# Patient Record
Sex: Male | Born: 1960 | Race: White | Hispanic: No | Marital: Married | Smoking: Never smoker
Health system: Southern US, Community
[De-identification: ages and names within clinical notes are randomized; demographics above are authoritative.]

## PROBLEM LIST (undated history)

## (undated) DIAGNOSIS — Z87442 Personal history of urinary calculi: Secondary | ICD-10-CM

## (undated) DIAGNOSIS — I10 Essential (primary) hypertension: Secondary | ICD-10-CM

## (undated) DIAGNOSIS — Z8489 Family history of other specified conditions: Secondary | ICD-10-CM

---

## 2021-09-21 ENCOUNTER — Other Ambulatory Visit: Payer: Self-pay | Admitting: Family Medicine

## 2021-09-21 DIAGNOSIS — M5416 Radiculopathy, lumbar region: Secondary | ICD-10-CM

## 2021-10-16 ENCOUNTER — Other Ambulatory Visit: Payer: Self-pay

## 2021-10-22 ENCOUNTER — Ambulatory Visit
Admission: RE | Admit: 2021-10-22 | Discharge: 2021-10-22 | Disposition: A | Discharge status: Home / Self Care | Payer: Self-pay | Source: Ambulatory Visit | Attending: Family Medicine | Admitting: Family Medicine

## 2021-10-22 ENCOUNTER — Other Ambulatory Visit: Payer: Self-pay

## 2021-10-22 DIAGNOSIS — M5416 Radiculopathy, lumbar region: Secondary | ICD-10-CM

## 2022-03-18 ENCOUNTER — Emergency Department: Payer: BC Managed Care – PPO

## 2022-03-18 ENCOUNTER — Ambulatory Visit
Admission: RE | Admit: 2022-03-18 | Discharge: 2022-03-18 | Disposition: A | Discharge status: Home / Self Care | Payer: BC Managed Care – PPO | Source: Ambulatory Visit

## 2022-03-18 ENCOUNTER — Other Ambulatory Visit: Payer: Self-pay

## 2022-03-18 ENCOUNTER — Emergency Department
Admission: EM | Admit: 2022-03-18 | Discharge: 2022-03-18 | Disposition: A | Discharge status: Home / Self Care | Payer: BC Managed Care – PPO | Attending: Emergency Medicine | Admitting: Emergency Medicine

## 2022-03-18 ENCOUNTER — Ambulatory Visit (INDEPENDENT_AMBULATORY_CARE_PROVIDER_SITE_OTHER): Payer: BC Managed Care – PPO

## 2022-03-18 ENCOUNTER — Encounter: Payer: Self-pay | Admitting: Emergency Medicine

## 2022-03-18 VITALS — BP 143/80 | HR 88 | Temp 98.8°F | Resp 14 | Ht 72.0 in | Wt 240.0 lb

## 2022-03-18 DIAGNOSIS — M795 Residual foreign body in soft tissue: Secondary | ICD-10-CM

## 2022-03-18 DIAGNOSIS — S60453A Superficial foreign body of left middle finger, initial encounter: Secondary | ICD-10-CM | POA: Insufficient documentation

## 2022-03-18 DIAGNOSIS — W458XXA Other foreign body or object entering through skin, initial encounter: Secondary | ICD-10-CM | POA: Insufficient documentation

## 2022-03-18 DIAGNOSIS — S6992XA Unspecified injury of left wrist, hand and finger(s), initial encounter: Secondary | ICD-10-CM

## 2022-03-18 DIAGNOSIS — S60459A Superficial foreign body of unspecified finger, initial encounter: Secondary | ICD-10-CM | POA: Diagnosis not present

## 2022-03-18 HISTORY — DX: Essential (primary) hypertension: I10

## 2022-03-18 MED ORDER — CEPHALEXIN 500 MG PO CAPS: 500.0000 mg | ORAL_CAPSULE | Freq: Three times a day (TID) | ORAL | 0 refills | Status: DC

## 2022-03-18 MED ORDER — BUPIVACAINE HCL (PF) 0.5 % IJ SOLN
20.0000 mL | Freq: Once | INTRAMUSCULAR | Status: AC
  Administered 2022-03-18: 10 mL
  Filled 2022-03-18: qty 20

## 2022-03-18 MED ORDER — CEPHALEXIN 500 MG PO CAPS
500.0000 mg | ORAL_CAPSULE | Freq: Once | ORAL | Status: AC
  Administered 2022-03-18: 500 mg via ORAL
  Filled 2022-03-18: qty 1

## 2022-03-18 NOTE — ED Triage Notes (Signed)
Patient c/o foreign body in LFT middle finger x 3-4 weeks.   Patient endorses  "steell belt on a tire stabbed my finger".   Patient endorse attempted to remove  a steel pin in finger".   Patient endorses increased pain and a blister that has formed.   Patient received a tetanus last month per patient statement.

## 2022-03-18 NOTE — ED Provider Triage Note (Signed)
Emergency Medicine Provider Triage Evaluation Note  NASH BOLLS , a 61 y.o. male  was evaluated in triage.  Pt complains of 3rd finger laceration. Happened at work with a steel piece of equipment that punctured his finger 6 months ago. Over the past 3 days he noticed white pus and a blister.  Wrapped it up, but continued to have pain so he stuck in a needle in it and dug around to find a splinter and went to urgent care who sent him here. The provider cut open his finger but couldn't get the foreign body so she told him to go home and soak his finger in epsom salts. He reports he was frustrated with that so he came to the ER for removal.  Tdap up to date.  XRs from today in computer  Review of Systems  Positive: L 3rd finger wrapped up Negative: Paresthesias, fever  Physical Exam  There were no vitals taken for this visit. Gen:   Awake, no distress   Resp:  Normal effort  MSK:   Moves extremities without difficulty Other:  L 3rd finger wrapped up  Medical Decision Making  Medically screening exam initiated at 4:05 PM.  Appropriate orders placed.  GERHART RUGGIERI was informed that the remainder of the evaluation will be completed by another provider, this initial triage assessment does not replace that evaluation, and the importance of remaining in the ED until their evaluation is complete.    Jackelyn Hoehn, PA-C 03/18/22 1612

## 2022-03-18 NOTE — Discharge Instructions (Signed)
Follow up with [facility], ask for hand specialist

## 2022-03-18 NOTE — ED Triage Notes (Signed)
Pt via POV from home. Pt had a small piece of metal into his L middle finger approx 6 weeks ago. Pt was seen at UC to get the it removed. States that he tried to get it out himself and made the pain worse a couple weeks ago. Pt was sent from the UC to here. Pt had it drained today and patient states the numbing medication is wearing off. Pt is A&OX4 and NAD>

## 2022-03-18 NOTE — Discharge Instructions (Signed)
We were unable to remove the metalic sliver in your finger with a small inision. I would recommend soaking your finger in very warm epsom salt water at the strength indicated on the box several times daily until it is removed. If it still does not come out, you may need a deeper surgical procedure with a hand specialist to remove it. Please apply topical antibiotic ointment to the affected area several times daily. Keep it covered during the day and open at night.

## 2022-03-18 NOTE — ED Provider Notes (Signed)
[facility] Provider Note    Event Date/Time   First MD Initiated Contact with Patient 03/18/22 Douglas Gibson     (approximate)   History   Laceration   HPI  Douglas Gibson is a 61 y.o. male with history of foreign body in his left middle finger for 6 weeks presents emergency department stating he would like to have foreign body removed.  Patient went to urgent care and they did try to remove it by doing x-rays and an incision.  They were unable to remove the small sliver of metal.  Patient's Tdap was updated.      Physical Exam   Triage Vital Signs: ED Triage Vitals  Enc Vitals Group     BP 03/18/22 1610 (!) 155/98     Pulse Rate 03/18/22 1610 79     Resp 03/18/22 1610 16     Temp 03/18/22 1610 98.6 F (37 C)     Temp Source 03/18/22 1610 Oral     SpO2 03/18/22 1610 98 %     Weight 03/18/22 1613 240 lb (108.9 kg)     Height 03/18/22 1613 6' (1.829 m)     Head Circumference --      Peak Flow --      Pain Score 03/18/22 1613 0     Pain Loc --      Pain Edu? --      Excl. in GC? --     Most recent vital signs: Vitals:   03/18/22 1610  BP: (!) 155/98  Pulse: 79  Resp: 16  Temp: 98.6 F (37 C)  SpO2: 98%     General: Awake, no distress.   CV:  Good peripheral perfusion. regular rate and  rhythm Resp:  Normal effort.  Abd:  No distention.   Other:  No obvious foreign body noted to the naked eye   ED Results / Procedures / Treatments   Labs (all labs ordered are listed, but only abnormal results are displayed) Labs Reviewed - No data to display   EKG     RADIOLOGY X-ray of the left middle finger reviewed from urgent care Repeat x-ray left middle finger    PROCEDURES:   .Nerve Block  Date/Time: 03/18/2022 9:20 PM  Performed by: Faythe Ghee, PA-C Authorized by: Faythe Ghee, PA-C   Consent:    Consent obtained:  Verbal   Consent given by:  Patient   Risks, benefits, and alternatives were discussed: yes     Risks  discussed:  Allergic reaction, infection, nerve damage, unsuccessful block, pain, intravenous injection and bleeding   Alternatives discussed:  No treatment Universal protocol:    Procedure explained and questions answered to patient or proxy's satisfaction: yes     Patient identity confirmed:  Verbally with patient Indications:    Indications:  Pain relief and procedural anesthesia Pre-procedure details:    Skin preparation:  Alcohol Skin anesthesia:    Skin anesthesia method:  None Procedure details:    Block needle gauge:  22 G   Anesthetic injected:  Bupivacaine 0.5% w/o epi   Injection procedure:  Anatomic landmarks identified, incremental injection, negative aspiration for blood, anatomic landmarks palpated and introduced needle Post-procedure details:    Outcome:  Anesthesia achieved   Procedure completion:  Tolerated    MEDICATIONS ORDERED IN ED: Medications  bupivacaine(PF) (MARCAINE) 0.5 % injection 20 mL (10 mLs Infiltration Given by Other 03/18/22 2000)  cephALEXin (KEFLEX) capsule 500 mg (500 mg Oral Given  03/18/22 2121)     IMPRESSION / MDM / ASSESSMENT AND PLAN / ED COURSE  I reviewed the triage vital signs and the nursing notes.                              Differential diagnosis includes, but is not limited to, foreign body, abrasion, infection  Patient's presentation is most consistent with acute complicated illness / injury requiring diagnostic workup.   We did try to remove the foreign body.  Patient was given a digital block.  We were unsuccessful and removing the foreign body.  We tried ultrasound guidance along with probing and were unable to feel the metal piece.  Due to the amount of probing we decided to place patient on antibiotic to prevent infection.  Feel that he should follow-up with hand specialist to have the area removed.  Return emergency department if worsening.  He is discharged stable condition   FINAL CLINICAL IMPRESSION(S) / ED DIAGNOSES    Final diagnoses:  Foreign body (FB) in soft tissue     Rx / DC Orders   ED Discharge Orders          Ordered    cephALEXin (KEFLEX) 500 MG capsule  3 times daily        03/18/22 2116             Note:  This document was prepared using Dragon voice recognition software and may include unintentional dictation errors.    Faythe Ghee, PA-C 03/18/22 2122    Sharyn Creamer, MD 03/19/22 Windy Fast

## 2022-03-21 NOTE — ED Provider Notes (Signed)
Renaldo Fiddler    CSN: [id number] Arrival date & time: 03/18/22  1231      History   Chief Complaint Chief Complaint  Patient presents with   Foreign Body    HPI Douglas Gibson is a 61 y.o. male.   Pleasant 61yo male presents today requesting removal of a FB in his L middle finger. 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 Believes roughly 6 weeks ago when he was strapping down a tire, the metal in it stabbed him in the finger. He states he updated his Tdap within one week of the injury. His pain from the puncture wound resolved shortly thereafter, but recently started bothering him again and he feels like there is a bump on the lateral aspect of his finger where the metal is trying to extrude. Pt denies any erythema or drainage from the site. Minimal swelling. FROM of digit and hand, normal sensation. Would like to have FB removed.    Foreign Body   Past Medical History:  Diagnosis Date   Hypertension     There are no problems to display for this patient.   History reviewed. No pertinent surgical history.     Home Medications    Prior to Admission medications   Medication Sig Start Date End Date Taking? Authorizing Provider  hydrochlorothiazide (HYDRODIURIL) 12.5 MG tablet Take 12.5 mg by mouth daily. 02/22/22  Yes [provider]  losartan (COZAAR) 100 MG tablet Take 100 mg by mouth daily. 02/22/22  Yes [provider]  meloxicam (MOBIC) 15 MG tablet Take 15 mg by mouth daily. 03/17/22  Yes [provider]  cephALEXin (KEFLEX) 500 MG capsule Take 1 capsule (500 mg total) by mouth 3 (three) times daily. 03/18/22   Sherrie Mustache Roselyn Bering, PA-C    Family History History reviewed. No pertinent family history.  Social History Social History   Tobacco Use   Smoking status: Never   Smokeless tobacco: Never  Substance Use Topics   Alcohol use: Not Currently   Drug use: Never     Allergies   Patient has no known  allergies.   Review of Systems Review of Systems  Musculoskeletal:  Positive for myalgias (L middle finger - distal phalanx).  All other systems reviewed and are negative.    Physical Exam Triage Vital Signs ED Triage Vitals  Enc Vitals Group     BP 03/18/22 1303 (!) 143/80     Pulse Rate 03/18/22 1303 88     Resp 03/18/22 1303 14     Temp 03/18/22 1303 98.8 F (37.1 C)     Temp Source 03/18/22 1303 Oral     SpO2 03/18/22 1303 96 %     Weight 03/18/22 1309 240 lb (108.9 kg)     Height 03/18/22 1309 6' (1.829 m)     Head Circumference --      Peak Flow --      Pain Score 03/18/22 1306 8     Pain Loc --      Pain Edu? --      Excl. in GC? --    No data found.  Updated Vital Signs BP (!) 143/80 (BP Location: Left Arm)   Pulse 88   Temp 98.8 F (37.1 C) (Oral)   Resp 14   Ht 6' (1.829 m)   Wt 240 lb (108.9 kg)   SpO2 96%   BMI 32.55 kg/m   Visual Acuity Right Eye Distance:   Left Eye  Distance:   Bilateral Distance:    Right Eye Near:   Left Eye Near:    Bilateral Near:     Physical Exam Vitals and nursing note reviewed.  Constitutional:      General: He is not in acute distress.    Appearance: Normal appearance. He is not ill-appearing, toxic-appearing or diaphoretic.  HENT:     Head: Normocephalic.  Cardiovascular:     Rate and Rhythm: Normal rate.  Pulmonary:     Effort: Pulmonary effort is normal. No respiratory distress.  Musculoskeletal:        General: Tenderness (to lateral/ radial aspect of distal phalanx L middle finger) present. No swelling. Normal range of motion.  Skin:    General: Skin is warm and dry.     Capillary Refill: Capillary refill takes less than 2 seconds.     Coloration: Skin is not jaundiced.     Findings: No bruising, erythema or rash.  Neurological:     General: No focal deficit present.     Mental Status: He is alert and oriented to person, place, and time.     Sensory: Sensory deficit present.     Motor: No weakness.      Gait: Gait normal.  Psychiatric:        Mood and Affect: Mood normal.        Behavior: Behavior normal.      UC Treatments / Results  Labs (all labs ordered are listed, but only abnormal results are displayed) Labs Reviewed - No data to display  EKG   Radiology LEFT MIDDLE FINGER 2+V   COMPARISON:  None Available.   FINDINGS: Linear 5 mm metallic foreign body within the soft tissues of the distal phalanx. Assuming the patient's ring is on his ring finger, this is located in the radial soft tissues of the distal phalanx. There is associated soft tissue swelling. No evidence of acute fracture, dislocation or bone destruction.   IMPRESSION: Metallic foreign body within the distal soft tissues of the long finger. No osseous abnormality.     Electronically Signed   By: Carey Bullocks M.D.   On: 03/18/2022 13:41  Procedures Foreign Body Removal  Date/Time: 03/18/2022 1:55 PM  Performed by: Maretta Bees, PA Authorized by: Maretta Bees, PA   Consent:    Consent obtained:  Verbal   Consent given by:  Patient   Risks, benefits, and alternatives were discussed: yes     Risks discussed:  Bleeding, incomplete removal, infection, nerve damage, pain, poor cosmetic result and worsening of condition   Alternatives discussed:  No treatment, alternative treatment and referral Universal protocol:    Procedure explained and questions answered to patient or proxy's satisfaction: yes     Imaging studies available: yes     Site/side marked: yes     Patient identity confirmed:  Verbally with patient Location:    Location:  Finger   Finger location:  L middle finger   Depth:  Subcutaneous   Tendon involvement:  None Pre-procedure details:    Imaging:  X-ray   Neurovascular status: intact   Anesthesia:    Anesthesia method:  Local infiltration   Local anesthetic:  Lidocaine 1% w/o epi Procedure type:    Procedure complexity:  Simple Procedure details:    Incision  length:  1mm   Dissection of underlying tissues: no     Bloodless field: no     Removal mechanism:  Hemostat, magnetic extraction and forceps   Guidance:  serial x-rays     Guidance comment:  Additional xray obtained with forceps in place to ensure proper location   Intact foreign body removal: no (UNABLE TO SUCCESSFULLY REMOVE FB)   Post-procedure details:    Neurovascular status: intact     Confirmation:  Residual foreign bodies remain   Skin closure:  None   Dressing:  Antibiotic ointment and non-adherent dressing   Procedure completion:  Procedure terminated electively by provider  (including critical care time)  Medications Ordered in UC Medications - No data to display  Initial Impression / Assessment and Plan / UC Course  I have reviewed the triage vital signs and the nursing notes.  Pertinent labs & imaging results that were available during my care of the patient were reviewed by me and considered in my medical decision making (see chart for details).     Metalic FB in L middle finger - confirmed by xray. Discussed with pt options included observation and allowing self-extrusion, FB removal in office, and referral to hand specialist. Pt opted to attempt FB removal. Ultimately the procedure was terminated after one hour of unsuccessful attempt. Additional xray was taken to confirm location of FB with forceps in place, confirming appropriate incision location. Discussed with pt that there may be scar tissue forming around the FB given the timeframe it has been in his finger. Recommend warm epsom salt soaks to see if this acts as a drawing sav, and follow up with hand specialist should it remain. Head to ER if any immediate complications.   Final Clinical Impressions(s) / UC Diagnoses   Final diagnoses:  Metal foreign body in finger     Discharge Instructions      We were unable to remove the metalic sliver in your finger with a small inision. I would recommend soaking  your finger in very warm epsom salt water at the strength indicated on the box several times daily until it is removed. If it still does not come out, you may need a deeper surgical procedure with a hand specialist to remove it. Please apply topical antibiotic ointment to the affected area several times daily. Keep it covered during the day and open at night.    ED Prescriptions   None    PDMP not reviewed this encounter.   Maretta Bees, [place] 03/21/22 2338

## 2022-04-10 NOTE — Progress Notes (Unsigned)
Referring Physician:  Merri Ray, MD [address] [place],  [place] [address]  Primary Physician:  Mick Sell, MD  History of Present Illness: 04/11/2022 Mr. Douglas Gibson is here today with a chief complaint of bilateral low back pain that radiates into the bilateral buttocks. Also complains of numbness that will get the entire right leg and intermittent numbness and tingling in the feet.   Been having worsening issues over the past 15 years.  His back and leg discomfort is now impacting his day-to-day life.  By the end of the day, he has significant numbness and discomfort in his legs.  He cannot stand in 1 position for as long as he would like due to discomfort.  Standing, walking, bending, and lifting make his pain worse.  It can be as bad as 8 out of 10.  He gets sharp discomfort into his buttocks and stiff and aching discomfort elsewhere.  Meloxicam has helped.  He has tried physical therapy and injections without improvement.  Bowel/Bladder Dysfunction: none  Conservative measures:  Physical therapy:  has participated in at [facility] including regular antiinflammatories: meloxicam Injections: has had epidural steroid injections 02/07/2022: MBB to the bilateral L4-5 and L5-S1 facet joints (8/10 to no relief) 12/13/2021: Bilateral S1 transforaminal ESI (questionable relief for 1 week)  Past Surgery: denies  Douglas Gibson has no symptoms of cervical myelopathy.  The symptoms are causing a significant impact on the patient's life.   Review of Systems:  A 10 point review of systems is negative, except for the pertinent positives and negatives detailed in the HPI.  Past Medical History: Past Medical History:  Diagnosis Date   Hypertension     Past Surgical History: No past surgical history on file.  Allergies: Allergies as of 04/11/2022   (No Known Allergies)    Medications: Current Meds  Medication Sig    hydrochlorothiazide (HYDRODIURIL) 12.5 MG tablet Take 12.5 mg by mouth daily.   losartan (COZAAR) 100 MG tablet Take 100 mg by mouth daily.   meloxicam (MOBIC) 15 MG tablet Take 15 mg by mouth daily.    Social History: Social History   Tobacco Use   Smoking status: Never   Smokeless tobacco: Never  Substance Use Topics   Alcohol use: Not Currently   Drug use: Never    Family Medical History: No family history on file.  Physical Examination: Vitals:   04/11/22 0826  BP: (!) 148/86    General: Patient is well developed, well nourished, calm, collected, and in no apparent distress. Attention to examination is appropriate.  Neck:   Supple.  Full range of motion.  Respiratory: Patient is breathing without any difficulty.   NEUROLOGICAL:     Awake, alert, oriented to person, place, and time.  Speech is clear and fluent. Fund of knowledge is appropriate.   Cranial Nerves: Pupils equal round and reactive to light.  Facial tone is symmetric.  Facial sensation is symmetric. Shoulder shrug is symmetric. Tongue protrusion is midline.  There is no pronator drift.  ROM of spine: full.    Strength: Side Biceps Triceps Deltoid Interossei Grip Wrist Ext. Wrist Flex.  R 5 5 5 5 5 5 5   L 5 5 5 5 5 5 5    Side Iliopsoas Quads Hamstring PF DF EHL  R 5 5 5 5 5 5   L 5 5 5 5 5 5    Reflexes are 1+ and symmetric at the biceps, triceps, brachioradialis, patella  and achilles.   Hoffman's is absent.  Clonus is not present.  Toes are down-going.  Bilateral upper and lower extremity sensation is intact to light touch.    No evidence of dysmetria noted.  Gait is normal.    Medical Decision Making  Imaging: MRI L spine 10/22/2021 L3-4: Mild bilateral foraminal stenosis due to disc bulge and facet arthropathy.   L4-5: Prominent central narrowing of the thecal sac with moderate bilateral foraminal stenosis and moderate bilateral subarticular lateral recess stenosis due to disc uncovering,  disc bulge, and degenerative facet arthropathy. Left foraminal annular tear noted. AP diameter of the thecal sac is 0.6 cm^2 and the cross-sectional area of the thecal sac is 0.3 cm^2   L5-S1: Mild bilateral foraminal stenosis and borderline right subarticular lateral recess stenosis due to disc bulge, intervertebral spurring, and facet arthropathy.   IMPRESSION: 1. Lumbar spondylosis and degenerative disc disease, causing prominent impingement at L4-5 and mild impingement at L2-3, L3-4, and L5-S1, as detailed above. Anterolisthesis at L4-5 is also contributory to the impingement at that level. 2. Degenerative facet edema bilaterally at L4-5 and to a lesser extent at L3-4.     Electronically Signed   By: Gaylyn Rong M.D.   On: 10/23/2021 13:34   Flexion-extension x-rays of the lumbar spine on April 11, 2022 show anterolisthesis at L4-5.  This is 5 and half millimeters on extension increasing to 11.3 mm on flexion.  I have personally reviewed the images and agree with the above interpretation.  Assessment and Plan: Douglas Gibson is a pleasant 61 y.o. 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  He has severe stenosis at L4-5 due to this.  He has 5 mm of change in his anterolisthesis between flexion and extension.  This suggests instability contributing to his current symptoms.  He has tried and failed conservative management including physical therapy and injections.  At this point, no further conservative management is indicated.  Given the anterolisthesis and facet arthrosis which results in abnormality in the L4-5 facet joints bilaterally and dislocation of the inferior articulating process with relation to the superior articulating process, surgical fusion will be necessary to reestablish a normal spinal canal diameter due to implied and inherent instability.  I recommend we proceed with L4-5 lateral lumbar interbody fusion with percutaneous fixation.   Would like to get flexion-extension x-rays today to determine whether lateral interbody fusion versus transforaminal interbody fusion is the most appropriate option.  With an anterolisthesis, I normally favor a lateral lumbar interbody fusion for anterior column reconstruction followed by percutaneous fixation.   I spent a total of 30 minutes in face-to-face and non-face-to-face activities related to this patient's care today.  Thank you for involving me in the care of this patient.      Jaevian Shean K. Myer Haff MD, [facility]

## 2022-04-11 ENCOUNTER — Ambulatory Visit
Admission: RE | Admit: 2022-04-11 | Discharge: 2022-04-11 | Disposition: A | Discharge status: Home / Self Care | Payer: BC Managed Care – PPO | Attending: Neurosurgery | Admitting: Neurosurgery

## 2022-04-11 ENCOUNTER — Ambulatory Visit: Payer: BC Managed Care – PPO | Admitting: Neurosurgery

## 2022-04-11 ENCOUNTER — Encounter: Payer: Self-pay | Admitting: Neurosurgery

## 2022-04-11 ENCOUNTER — Ambulatory Visit
Admission: RE | Admit: 2022-04-11 | Discharge: 2022-04-11 | Disposition: A | Discharge status: Home / Self Care | Payer: BC Managed Care – PPO | Source: Ambulatory Visit | Attending: Neurosurgery | Admitting: Neurosurgery

## 2022-04-11 VITALS — BP 148/86 | Ht 72.0 in | Wt 240.0 lb

## 2022-04-11 DIAGNOSIS — M4316 Spondylolisthesis, lumbar region: Secondary | ICD-10-CM

## 2022-04-11 DIAGNOSIS — G8929 Other chronic pain: Secondary | ICD-10-CM | POA: Insufficient documentation

## 2022-04-11 DIAGNOSIS — M5441 Lumbago with sciatica, right side: Secondary | ICD-10-CM | POA: Insufficient documentation

## 2022-04-11 DIAGNOSIS — M5442 Lumbago with sciatica, left side: Secondary | ICD-10-CM

## 2022-04-14 ENCOUNTER — Other Ambulatory Visit: Payer: Self-pay

## 2022-04-14 DIAGNOSIS — Z01818 Encounter for other preprocedural examination: Secondary | ICD-10-CM

## 2022-04-18 ENCOUNTER — Telehealth: Payer: Self-pay

## 2022-04-18 NOTE — Telephone Encounter (Signed)
-----   Message from Rockey Situ sent at 04/18/2022  8:08 AM EDT ----- Regarding: r/s sx Contact: [phone] I called patient to give him his pre-op appt and he said that he needs to reschedule his surgery date.

## 2022-04-18 NOTE — Telephone Encounter (Signed)
I spoke with Douglas Gibson. We have rescheduled his surgery to 05/22/22.

## 2022-04-26 ENCOUNTER — Telehealth: Payer: Self-pay

## 2022-04-26 MED ORDER — MELOXICAM 15 MG PO TABS: 15.0000 mg | ORAL_TABLET | Freq: Every day | ORAL | 0 refills | Status: DC

## 2022-04-26 NOTE — Telephone Encounter (Signed)
-----   Message from Rockey Situ sent at 04/26/2022  8:16 AM EDT ----- Regarding: URGENT request Contact: [phone]  L4-5 XLIF/PSF on 05/22/22 [facility] [employer] Meloxicam 15mg  1 aday. Can the office prescribe this to get him through to surgery? Patient has contacted for the past 4 days with out a response. Patient is leaving town to his son's wedding by plane today at 4pm..

## 2022-04-26 NOTE — Telephone Encounter (Signed)
I sent in a refill for him to take up until surgery.   Patty, please remind him that he cannot take this for 3 months from the date of his surgery.

## 2022-04-26 NOTE — Telephone Encounter (Signed)
Patient notified and he aware that this medication can not be taken for 3 months after surgery.

## 2022-04-28 ENCOUNTER — Other Ambulatory Visit: Payer: BC Managed Care – PPO

## 2022-05-10 ENCOUNTER — Encounter
Admission: RE | Admit: 2022-05-10 | Discharge: 2022-05-10 | Disposition: A | Discharge status: Home / Self Care | Payer: BC Managed Care – PPO | Source: Ambulatory Visit | Attending: Neurosurgery | Admitting: Neurosurgery

## 2022-05-10 VITALS — BP 142/87 | HR 84 | Temp 98.7°F | Resp 16 | Ht 72.0 in | Wt 243.9 lb

## 2022-05-10 DIAGNOSIS — I10 Essential (primary) hypertension: Secondary | ICD-10-CM | POA: Diagnosis not present

## 2022-05-10 DIAGNOSIS — Z01812 Encounter for preprocedural laboratory examination: Secondary | ICD-10-CM

## 2022-05-10 DIAGNOSIS — Z01818 Encounter for other preprocedural examination: Secondary | ICD-10-CM | POA: Insufficient documentation

## 2022-05-10 HISTORY — DX: Personal history of urinary calculi: Z87.442

## 2022-05-10 HISTORY — DX: Family history of other specified conditions: Z84.89

## 2022-05-10 LAB — URINALYSIS, ROUTINE W REFLEX MICROSCOPIC
Bilirubin Urine: NEGATIVE
Glucose, UA: NEGATIVE mg/dL
Hgb urine dipstick: NEGATIVE
Ketones, ur: NEGATIVE mg/dL
Leukocytes,Ua: NEGATIVE
Nitrite: NEGATIVE
Protein, ur: NEGATIVE mg/dL
Specific Gravity, Urine: 1.017 (ref 1.005–1.030)
pH: 5 (ref 5.0–8.0)

## 2022-05-10 LAB — BASIC METABOLIC PANEL
Anion gap: 6 (ref 5–15)
BUN: 20 mg/dL (ref 6–20)
CO2: 29 mmol/L (ref 22–32)
Calcium: 9.5 mg/dL (ref 8.9–10.3)
Chloride: 105 mmol/L (ref 98–111)
Creatinine, Ser: 0.94 mg/dL (ref 0.61–1.24)
GFR, Estimated: >60 mL/min (ref >60)
Glucose, Bld: 153 mg/dL — ABNORMAL HIGH (ref 70–99)
Potassium: 3.7 mmol/L (ref 3.5–5.1)
Sodium: 140 mmol/L (ref 135–145)

## 2022-05-10 LAB — CBC
HCT: 40.9 % (ref 39.0–52.0)
Hemoglobin: 14.1 g/dL (ref 13.0–17.0)
MCH: 32.9 pg (ref 26.0–34.0)
MCHC: 34.5 g/dL (ref 30.0–36.0)
MCV: 95.6 fL (ref 80.0–100.0)
Platelets: 194 10*3/uL (ref 150–400)
RBC: 4.28 MIL/uL (ref 4.22–5.81)
RDW: 12.8 % (ref 11.5–15.5)
WBC: 8.2 10*3/uL (ref 4.0–10.5)
nRBC: 0 % (ref 0.0–0.2)

## 2022-05-10 LAB — SURGICAL PCR SCREEN
MRSA, PCR: NEGATIVE
Staphylococcus aureus: NEGATIVE

## 2022-05-10 LAB — TYPE AND SCREEN
ABO/RH(D): O POS
Antibody Screen: NEGATIVE

## 2022-05-10 NOTE — Patient Instructions (Signed)
Your procedure is scheduled on: Monday May 22, 2022. Report to [facility] inside [facility] 2nd floor, stop by registration desk first before getting on elevator.  To find out your arrival time please call [phone] between 1PM - 3PM on Friday May 19, 2022.  Remember: Instructions that are not followed completely may result in serious medical risk,  up to and including death, or upon the discretion of your surgeon and anesthesiologist your  surgery may need to be rescheduled.     _X__ 1. Do not eat food after midnight the night before your procedure.                 No chewing gum or hard candies. You may drink clear liquids up to 2 hours                 before you are scheduled to arrive for your surgery- DO not drink clear                 liquids within 2 hours of the start of your surgery.                 Clear Liquids include:  water, apple juice without pulp, clear Gatorade, G2 or                  Gatorade Zero (avoid Red/Purple/Blue), Black Coffee or Tea (Do not add                 anything to coffee or tea).  __X__2.  On the morning of surgery brush your teeth with toothpaste and water, you                may rinse your mouth with mouthwash if you wish.  Do not swallow any toothpaste or mouthwash.     _X__ 3.  No Alcohol for 24 hours before or after surgery.   _X__ 4.  Do Not Smoke or use e-cigarettes For 24 Hours Prior to Your Surgery.                 Do not use any chewable tobacco products for at least 6 hours prior to                 Surgery.  _X__  5.  Do not use any recreational drugs (marijuana, cocaine, heroin, ecstasy, MDMA or other)                For at least one week prior to your surgery.  Combination of these drugs with anesthesia                May have life threatening results.  ____  6.  Bring all medications with you on the day of surgery if instructed.   __X__  7.  Notify your doctor if there is any change in your medical  condition      (cold, fever, infections).     Do not wear jewelry, make-up, hairpins, clips or nail polish. Do not wear lotions, powders, or perfumes. You may wear deodorant. Do not shave 48 hours prior to surgery. Men may shave face and neck. Do not bring valuables to the hospital.    [facility] is not responsible for any belongings or valuables.  Contacts, dentures or bridgework may not be worn into surgery. Leave your suitcase in the car. After surgery it may be brought to your room. For patients admitted to the hospital, discharge time  is determined by your treatment team.   Patients discharged the day of surgery will not be allowed to drive home.   Make arrangements for someone to be with you for the first 24 hours of your Same Day Discharge.   __X__ Take these medicines the morning of surgery with A SIP OF WATER:    1. None   2.   3.   4.  5.  6.  ____ Fleet Enema (as directed)   __X__ Use CHG Soap (or wipes) as directed  ____ Use Benzoyl Peroxide Gel as instructed  ____ Use inhalers on the day of surgery  ____ Stop metformin 2 days prior to surgery    ____ Take 1/2 of usual insulin dose the night before surgery. No insulin the morning          of surgery.   ____ Call your PCP, cardiologist, or Pulmonologist if taking Coumadin/Plavix/aspirin and ask when to stop before your surgery.   __X__ One Week prior to surgery- Stop Anti-inflammatories such as Ibuprofen, Aleve, Advil, Motrin, meloxicam (MOBIC), diclofenac, etodolac, ketorolac, Toradol, Daypro, piroxicam, Goody's or BC powders. OK TO USE TYLENOL IF NEEDED   __X__ Do not start any vitamins and or supplements until after surgery.    ____ Bring C-Pap to the hospital.    If you have any questions regarding your pre-procedure instructions,  Please call Pre-admit Testing at [phone]

## 2022-05-17 ENCOUNTER — Telehealth: Payer: Self-pay

## 2022-05-17 NOTE — Telephone Encounter (Signed)
-----   Message from Peggyann Shoals sent at 05/17/2022  1:04 PM EDT ----- Regarding: drainage sx on 10/9 Contact: [phone]  L4-5 XLIF/PSF on 05/22/22 For a week he has had runny nose and his voice is raspy. No fever, he feels fine, negative Covid. He has not taken anything OTC for these symptoms. Preop told him to check with the surgeon.

## 2022-05-17 NOTE — Telephone Encounter (Signed)
He agreed with Dr.Yarbrough's response.

## 2022-05-22 ENCOUNTER — Other Ambulatory Visit: Payer: Self-pay

## 2022-05-22 ENCOUNTER — Inpatient Hospital Stay: Payer: BC Managed Care – PPO

## 2022-05-22 ENCOUNTER — Encounter: Payer: Self-pay | Admitting: Neurosurgery

## 2022-05-22 ENCOUNTER — Inpatient Hospital Stay
Admission: RE | Admit: 2022-05-22 | Discharge: 2022-05-23 | DRG: 455 | Disposition: A | Discharge status: Home / Self Care | Payer: BC Managed Care – PPO | Attending: Neurosurgery | Admitting: Neurosurgery

## 2022-05-22 ENCOUNTER — Encounter
Admission: RE | Disposition: A | Discharge status: Home / Self Care | Payer: Self-pay | Source: Home / Self Care | Attending: Neurosurgery

## 2022-05-22 ENCOUNTER — Inpatient Hospital Stay: Payer: BC Managed Care – PPO | Admitting: Anesthesiology

## 2022-05-22 ENCOUNTER — Inpatient Hospital Stay: Payer: BC Managed Care – PPO | Admitting: Urgent Care

## 2022-05-22 DIAGNOSIS — M5442 Lumbago with sciatica, left side: Secondary | ICD-10-CM | POA: Diagnosis present

## 2022-05-22 DIAGNOSIS — M4316 Spondylolisthesis, lumbar region: Secondary | ICD-10-CM | POA: Diagnosis present

## 2022-05-22 DIAGNOSIS — M5441 Lumbago with sciatica, right side: Secondary | ICD-10-CM | POA: Diagnosis present

## 2022-05-22 DIAGNOSIS — G8929 Other chronic pain: Secondary | ICD-10-CM | POA: Diagnosis present

## 2022-05-22 DIAGNOSIS — I10 Essential (primary) hypertension: Secondary | ICD-10-CM | POA: Diagnosis present

## 2022-05-22 DIAGNOSIS — Z01818 Encounter for other preprocedural examination: Secondary | ICD-10-CM

## 2022-05-22 HISTORY — PX: APPLICATION OF INTRAOPERATIVE CT SCAN: SHX6668

## 2022-05-22 HISTORY — PX: ANTERIOR LATERAL LUMBAR FUSION WITH PERCUTANEOUS SCREW 1 LEVEL: SHX5553

## 2022-05-22 LAB — ABO/RH: ABO/RH(D): O POS

## 2022-05-22 SURGERY — ANTERIOR LATERAL LUMBAR FUSION WITH PERCUTANEOUS SCREW 1 LEVEL
Anesthesia: General

## 2022-05-22 MED ORDER — CELECOXIB 200 MG PO CAPS
200.0000 mg | ORAL_CAPSULE | Freq: Two times a day (BID) | ORAL | Status: DC
  Administered 2022-05-22 – 2022-05-23 (×2): 200 mg via ORAL
  Filled 2022-05-22 (×3): qty 1

## 2022-05-22 MED ORDER — DEXAMETHASONE SODIUM PHOSPHATE 10 MG/ML IJ SOLN
INTRAMUSCULAR | Status: DC | PRN
  Administered 2022-05-22: 8 mg via INTRAVENOUS

## 2022-05-22 MED ORDER — REMIFENTANIL HCL 1 MG IV SOLR
INTRAVENOUS | Status: DC | PRN
  Administered 2022-05-22: .2 ug/kg/min via INTRAVENOUS

## 2022-05-22 MED ORDER — ROCURONIUM BROMIDE 100 MG/10ML IV SOLN
INTRAVENOUS | Status: DC | PRN
  Administered 2022-05-22: 30 mg via INTRAVENOUS

## 2022-05-22 MED ORDER — PHENYLEPHRINE HCL-NACL 20-0.9 MG/250ML-% IV SOLN
INTRAVENOUS | Status: AC
  Filled 2022-05-22: qty 250

## 2022-05-22 MED ORDER — BUPIVACAINE LIPOSOME 1.3 % IJ SUSP
INTRAMUSCULAR | Status: AC
  Filled 2022-05-22: qty 20

## 2022-05-22 MED ORDER — MORPHINE SULFATE (PF) 2 MG/ML IV SOLN
INTRAVENOUS | Status: AC
  Administered 2022-05-22: 2 mg via INTRAVENOUS
  Filled 2022-05-22: qty 1

## 2022-05-22 MED ORDER — LIDOCAINE HCL (CARDIAC) PF 100 MG/5ML IV SOSY
PREFILLED_SYRINGE | INTRAVENOUS | Status: DC | PRN
  Administered 2022-05-22: 100 mg via INTRAVENOUS

## 2022-05-22 MED ORDER — MIDAZOLAM HCL 2 MG/2ML IJ SOLN
INTRAMUSCULAR | Status: DC | PRN
  Administered 2022-05-22: 2 mg via INTRAVENOUS

## 2022-05-22 MED ORDER — VANCOMYCIN HCL IN DEXTROSE 1-5 GM/200ML-% IV SOLN
INTRAVENOUS | Status: AC
  Administered 2022-05-22: 1000 mg via INTRAVENOUS
  Filled 2022-05-22: qty 200

## 2022-05-22 MED ORDER — ONDANSETRON HCL 4 MG/2ML IJ SOLN: 4.0000 mg | Freq: Once | INTRAMUSCULAR | Status: DC | PRN

## 2022-05-22 MED ORDER — MENTHOL 3 MG MT LOZG: 1.0000 | LOZENGE | OROMUCOSAL | Status: DC | PRN

## 2022-05-22 MED ORDER — METHOCARBAMOL 500 MG PO TABS
500.0000 mg | ORAL_TABLET | Freq: Four times a day (QID) | ORAL | Status: DC | PRN
  Administered 2022-05-22: 500 mg via ORAL
  Filled 2022-05-22: qty 1

## 2022-05-22 MED ORDER — CEFAZOLIN SODIUM-DEXTROSE 2-4 GM/100ML-% IV SOLN
2.0000 g | Freq: Once | INTRAVENOUS | Status: AC
  Administered 2022-05-22: 2 g via INTRAVENOUS

## 2022-05-22 MED ORDER — FENTANYL CITRATE (PF) 250 MCG/5ML IJ SOLN
INTRAMUSCULAR | Status: AC
  Filled 2022-05-22: qty 5

## 2022-05-22 MED ORDER — SODIUM CHLORIDE 0.9 % IV SOLN: INTRAVENOUS | Status: DC

## 2022-05-22 MED ORDER — ENOXAPARIN SODIUM 40 MG/0.4ML IJ SOSY
40.0000 mg | PREFILLED_SYRINGE | INTRAMUSCULAR | Status: DC
  Administered 2022-05-23: 40 mg via SUBCUTANEOUS
  Filled 2022-05-22: qty 0.4

## 2022-05-22 MED ORDER — LACTATED RINGERS IV SOLN: INTRAVENOUS | Status: DC

## 2022-05-22 MED ORDER — SODIUM CHLORIDE 0.9% FLUSH
3.0000 mL | Freq: Two times a day (BID) | INTRAVENOUS | Status: DC
  Administered 2022-05-22 – 2022-05-23 (×3): 3 mL via INTRAVENOUS

## 2022-05-22 MED ORDER — ACETAMINOPHEN 10 MG/ML IV SOLN: 1000.0000 mg | Freq: Once | INTRAVENOUS | Status: DC | PRN

## 2022-05-22 MED ORDER — MORPHINE SULFATE (PF) 2 MG/ML IV SOLN
2.0000 mg | INTRAVENOUS | Status: DC | PRN
  Administered 2022-05-22: 2 mg via INTRAVENOUS
  Filled 2022-05-22: qty 1

## 2022-05-22 MED ORDER — PROPOFOL 1000 MG/100ML IV EMUL
INTRAVENOUS | Status: AC
  Filled 2022-05-22: qty 100

## 2022-05-22 MED ORDER — ACETAMINOPHEN 325 MG PO TABS: 650.0000 mg | ORAL_TABLET | ORAL | Status: DC | PRN

## 2022-05-22 MED ORDER — SURGIPHOR WOUND IRRIGATION SYSTEM - OPTIME
TOPICAL | Status: DC | PRN
  Administered 2022-05-22: 450 mL via TOPICAL

## 2022-05-22 MED ORDER — CHLORHEXIDINE GLUCONATE 0.12 % MT SOLN
OROMUCOSAL | Status: AC
  Filled 2022-05-22: qty 15

## 2022-05-22 MED ORDER — OXYCODONE HCL 5 MG PO TABS
5.0000 mg | ORAL_TABLET | Freq: Once | ORAL | Status: AC | PRN
  Administered 2022-05-22: 5 mg via ORAL

## 2022-05-22 MED ORDER — SUCCINYLCHOLINE CHLORIDE 200 MG/10ML IV SOSY
PREFILLED_SYRINGE | INTRAVENOUS | Status: DC | PRN
  Administered 2022-05-22: 100 mg via INTRAVENOUS

## 2022-05-22 MED ORDER — FAMOTIDINE 20 MG PO TABS: 20.0000 mg | ORAL_TABLET | Freq: Once | ORAL | Status: DC

## 2022-05-22 MED ORDER — ACETAMINOPHEN 10 MG/ML IV SOLN
INTRAVENOUS | Status: DC | PRN
  Administered 2022-05-22: 1000 mg via INTRAVENOUS

## 2022-05-22 MED ORDER — SODIUM CHLORIDE 0.9% FLUSH: 3.0000 mL | INTRAVENOUS | Status: DC | PRN

## 2022-05-22 MED ORDER — ONDANSETRON HCL 4 MG PO TABS: 4.0000 mg | ORAL_TABLET | Freq: Four times a day (QID) | ORAL | Status: DC | PRN

## 2022-05-22 MED ORDER — PHENOL 1.4 % MT LIQD: 1.0000 | OROMUCOSAL | Status: DC | PRN

## 2022-05-22 MED ORDER — HYDROCHLOROTHIAZIDE 12.5 MG PO TABS
12.5000 mg | ORAL_TABLET | Freq: Every day | ORAL | Status: DC
  Administered 2022-05-22 – 2022-05-23 (×2): 12.5 mg via ORAL
  Filled 2022-05-22 (×2): qty 1

## 2022-05-22 MED ORDER — REMIFENTANIL HCL 1 MG IV SOLR
INTRAVENOUS | Status: AC
  Filled 2022-05-22: qty 1000

## 2022-05-22 MED ORDER — BUPIVACAINE-EPINEPHRINE 0.5% -1:200000 IJ SOLN
INTRAMUSCULAR | Status: DC | PRN
  Administered 2022-05-22: 2 mL
  Administered 2022-05-22: 10 mL

## 2022-05-22 MED ORDER — SODIUM CHLORIDE (PF) 0.9 % IJ SOLN
INTRAMUSCULAR | Status: DC | PRN
  Administered 2022-05-22: 60 mL via INTRAMUSCULAR

## 2022-05-22 MED ORDER — LACTATED RINGERS IV SOLN: INTRAVENOUS | Status: DC | PRN

## 2022-05-22 MED ORDER — OXYCODONE HCL 5 MG/5ML PO SOLN: 5.0000 mg | Freq: Once | ORAL | Status: AC | PRN

## 2022-05-22 MED ORDER — GLYCOPYRROLATE 0.2 MG/ML IJ SOLN
INTRAMUSCULAR | Status: DC | PRN
  Administered 2022-05-22: .2 mg via INTRAVENOUS

## 2022-05-22 MED ORDER — ACETAMINOPHEN 650 MG RE SUPP: 650.0000 mg | RECTAL | Status: DC | PRN

## 2022-05-22 MED ORDER — SURGIFLO WITH THROMBIN (HEMOSTATIC MATRIX KIT) OPTIME
TOPICAL | Status: DC | PRN
  Administered 2022-05-22: 1 via TOPICAL

## 2022-05-22 MED ORDER — ACETAMINOPHEN 10 MG/ML IV SOLN
INTRAVENOUS | Status: AC
  Filled 2022-05-22: qty 100

## 2022-05-22 MED ORDER — CEFAZOLIN SODIUM-DEXTROSE 2-4 GM/100ML-% IV SOLN
INTRAVENOUS | Status: AC
  Filled 2022-05-22: qty 100

## 2022-05-22 MED ORDER — ONDANSETRON HCL 4 MG/2ML IJ SOLN: 4.0000 mg | Freq: Four times a day (QID) | INTRAMUSCULAR | Status: DC | PRN

## 2022-05-22 MED ORDER — FENTANYL CITRATE (PF) 100 MCG/2ML IJ SOLN
25.0000 ug | INTRAMUSCULAR | Status: DC | PRN
  Administered 2022-05-22 (×3): 50 ug via INTRAVENOUS

## 2022-05-22 MED ORDER — OXYCODONE HCL 5 MG PO TABS
10.0000 mg | ORAL_TABLET | ORAL | Status: DC | PRN
  Administered 2022-05-22 – 2022-05-23 (×2): 10 mg via ORAL
  Filled 2022-05-22 (×2): qty 2

## 2022-05-22 MED ORDER — OXYCODONE HCL 5 MG PO TABS
ORAL_TABLET | ORAL | Status: AC
  Filled 2022-05-22: qty 1

## 2022-05-22 MED ORDER — OXYCODONE HCL 5 MG PO TABS: 5.0000 mg | ORAL_TABLET | ORAL | Status: DC | PRN

## 2022-05-22 MED ORDER — SUGAMMADEX SODIUM 200 MG/2ML IV SOLN
INTRAVENOUS | Status: DC | PRN
  Administered 2022-05-22: 200 mg via INTRAVENOUS

## 2022-05-22 MED ORDER — KETOROLAC TROMETHAMINE 15 MG/ML IJ SOLN
INTRAMUSCULAR | Status: AC
  Filled 2022-05-22: qty 1

## 2022-05-22 MED ORDER — EPHEDRINE SULFATE (PRESSORS) 50 MG/ML IJ SOLN
INTRAMUSCULAR | Status: DC | PRN
  Administered 2022-05-22 (×2): 10 mg via INTRAVENOUS

## 2022-05-22 MED ORDER — CHLORHEXIDINE GLUCONATE 0.12 % MT SOLN: 15.0000 mL | Freq: Once | OROMUCOSAL | Status: AC

## 2022-05-22 MED ORDER — LOSARTAN POTASSIUM 50 MG PO TABS
100.0000 mg | ORAL_TABLET | Freq: Every day | ORAL | Status: DC
  Administered 2022-05-22 – 2022-05-23 (×2): 100 mg via ORAL
  Filled 2022-05-22 (×2): qty 2

## 2022-05-22 MED ORDER — PROPOFOL 10 MG/ML IV BOLUS
INTRAVENOUS | Status: DC | PRN
  Administered 2022-05-22: 200 mg via INTRAVENOUS
  Administered 2022-05-22: 30 ug/kg/min via INTRAVENOUS

## 2022-05-22 MED ORDER — ONDANSETRON HCL 4 MG/2ML IJ SOLN
INTRAMUSCULAR | Status: DC | PRN
  Administered 2022-05-22: 4 mg via INTRAVENOUS

## 2022-05-22 MED ORDER — MIDAZOLAM HCL 2 MG/2ML IJ SOLN
INTRAMUSCULAR | Status: AC
  Filled 2022-05-22: qty 2

## 2022-05-22 MED ORDER — METHOCARBAMOL 1000 MG/10ML IJ SOLN
500.0000 mg | Freq: Four times a day (QID) | INTRAVENOUS | Status: DC | PRN
  Administered 2022-05-22: 500 mg via INTRAVENOUS
  Filled 2022-05-22: qty 500

## 2022-05-22 MED ORDER — SENNA 8.6 MG PO TABS
1.0000 | ORAL_TABLET | Freq: Two times a day (BID) | ORAL | Status: DC
  Administered 2022-05-22 – 2022-05-23 (×3): 8.6 mg via ORAL
  Filled 2022-05-22 (×3): qty 1

## 2022-05-22 MED ORDER — ORAL CARE MOUTH RINSE
15.0000 mL | Freq: Once | OROMUCOSAL | Status: AC
  Administered 2022-05-22: 15 mL via OROMUCOSAL

## 2022-05-22 MED ORDER — PHENYLEPHRINE HCL-NACL 20-0.9 MG/250ML-% IV SOLN
INTRAVENOUS | Status: DC | PRN
  Administered 2022-05-22: 40 ug/min via INTRAVENOUS

## 2022-05-22 MED ORDER — KETOROLAC TROMETHAMINE 15 MG/ML IJ SOLN
15.0000 mg | Freq: Four times a day (QID) | INTRAMUSCULAR | Status: AC
  Administered 2022-05-22 – 2022-05-23 (×4): 15 mg via INTRAVENOUS
  Filled 2022-05-22 (×3): qty 1

## 2022-05-22 MED ORDER — FENTANYL CITRATE (PF) 100 MCG/2ML IJ SOLN
INTRAMUSCULAR | Status: AC
  Filled 2022-05-22: qty 2

## 2022-05-22 MED ORDER — SODIUM CHLORIDE FLUSH 0.9 % IV SOLN
INTRAVENOUS | Status: AC
  Filled 2022-05-22: qty 10

## 2022-05-22 MED ORDER — SODIUM CHLORIDE 0.9 % IV SOLN: 250.0000 mL | INTRAVENOUS | Status: DC

## 2022-05-22 MED ORDER — BUPIVACAINE HCL (PF) 0.5 % IJ SOLN
INTRAMUSCULAR | Status: AC
  Filled 2022-05-22: qty 30

## 2022-05-22 MED ORDER — BUPIVACAINE-EPINEPHRINE (PF) 0.5% -1:200000 IJ SOLN
INTRAMUSCULAR | Status: AC
  Filled 2022-05-22: qty 30

## 2022-05-22 MED ORDER — VANCOMYCIN HCL IN DEXTROSE 1-5 GM/200ML-% IV SOLN: 1000.0000 mg | Freq: Once | INTRAVENOUS | Status: AC

## 2022-05-22 MED ORDER — PHENYLEPHRINE HCL (PRESSORS) 10 MG/ML IV SOLN
INTRAVENOUS | Status: DC | PRN
  Administered 2022-05-22: 160 ug via INTRAVENOUS

## 2022-05-22 MED ORDER — FENTANYL CITRATE (PF) 100 MCG/2ML IJ SOLN
INTRAMUSCULAR | Status: DC | PRN
  Administered 2022-05-22: 75 ug via INTRAVENOUS
  Administered 2022-05-22: 100 ug via INTRAVENOUS

## 2022-05-22 MED ORDER — SODIUM CHLORIDE FLUSH 0.9 % IV SOLN
INTRAVENOUS | Status: AC
  Filled 2022-05-22: qty 20

## 2022-05-22 SURGICAL SUPPLY — 86 items
ALLOGRAFT BONESTRP KORE 2.5X10 (Bone Implant) IMPLANT
BASIN KIT SINGLE STR (MISCELLANEOUS) ×1 IMPLANT
BUR NEURO DRILL SOFT 3.0X3.8M (BURR) IMPLANT
CAP LOCKING SPINE (Cap) IMPLANT
CHLORAPREP W/TINT 26 (MISCELLANEOUS) ×4 IMPLANT
CORD BIP STRL DISP 12FT (MISCELLANEOUS) ×1 IMPLANT
CORD LIGHT LATERIAL X LIFT (MISCELLANEOUS) IMPLANT
COUNTER NEEDLE 20/40 LG (NEEDLE) ×2 IMPLANT
COVER BACK TABLE REUSABLE LG (DRAPES) ×1 IMPLANT
CUP MEDICINE 2OZ PLAST GRAD ST (MISCELLANEOUS) ×1 IMPLANT
DERMABOND ADVANCED .7 DNX12 (GAUZE/BANDAGES/DRESSINGS) ×3 IMPLANT
DRAPE 3D C-ARM OEC (DRAPES) IMPLANT
DRAPE C ARM PK CFD 31 SPINE (DRAPES) ×1 IMPLANT
DRAPE C-ARMOR (DRAPES) IMPLANT
DRAPE INCISE IOBAN 66X45 STRL (DRAPES) ×1 IMPLANT
DRAPE LAPAROTOMY 100X77 ABD (DRAPES) ×2 IMPLANT
DRAPE MICROSCOPE SPINE 48X150 (DRAPES) IMPLANT
DRAPE SCAN PATIENT (DRAPES) ×1 IMPLANT
DRAPE SURG 17X11 SM STRL (DRAPES) ×2 IMPLANT
DRSG OPSITE POSTOP 4X6 (GAUZE/BANDAGES/DRESSINGS) IMPLANT
DRSG TEGADERM 2-3/8X2-3/4 SM (GAUZE/BANDAGES/DRESSINGS) IMPLANT
DRSG TEGADERM 4X4.75 (GAUZE/BANDAGES/DRESSINGS) IMPLANT
DRSG TEGADERM 6X8 (GAUZE/BANDAGES/DRESSINGS) IMPLANT
ELECT CAUTERY BLADE TIP 2.5 (TIP) ×1
ELECT EZSTD 165MM 6.5IN (MISCELLANEOUS) ×1
ELECT REM PT RETURN 9FT ADLT (ELECTROSURGICAL) ×2
ELECTRODE CAUTERY BLDE TIP 2.5 (TIP) ×1 IMPLANT
ELECTRODE EZSTD 165MM 6.5IN (MISCELLANEOUS) ×1 IMPLANT
ELECTRODE REM PT RTRN 9FT ADLT (ELECTROSURGICAL) ×2 IMPLANT
EX-PIN ORTHOLOCK NAV 4X150 (PIN) IMPLANT
FEE INTRAOP CADWELL SUPPLY NCS (MISCELLANEOUS) ×1 IMPLANT
FEE INTRAOP MONITOR IMPULS NCS (MISCELLANEOUS) ×1 IMPLANT
FORCEPS BPLR BAYO 10IN 1.0TIP (ORTHOPEDIC DISPOSABLE SUPPLIES) IMPLANT
GAUZE 4X4 16PLY ~~LOC~~+RFID DBL (SPONGE) ×1 IMPLANT
GLOVE BIOGEL PI IND STRL 6.5 (GLOVE) ×3 IMPLANT
GLOVE SURG SYN 6.5 ES PF (GLOVE) ×5 IMPLANT
GLOVE SURG SYN 6.5 PF PI (GLOVE) ×5 IMPLANT
GLOVE SURG SYN 8.5  E (GLOVE) ×6
GLOVE SURG SYN 8.5 E (GLOVE) ×6 IMPLANT
GLOVE SURG SYN 8.5 PF PI (GLOVE) ×6 IMPLANT
GOWN SRG LRG LVL 4 IMPRV REINF (GOWNS) ×2 IMPLANT
GOWN SRG XL LVL 3 NONREINFORCE (GOWNS) ×2 IMPLANT
GOWN STRL NON-REIN TWL XL LVL3 (GOWNS) ×2
GOWN STRL REIN LRG LVL4 (GOWNS) ×2
GRADUATE 1200CC STRL 31836 (MISCELLANEOUS) ×1 IMPLANT
HOLDER FOLEY CATH W/STRAP (MISCELLANEOUS) ×1 IMPLANT
INTRAOP CADWELL SUPPLY FEE NCS (MISCELLANEOUS) ×1
INTRAOP DISP SUPPLY FEE NCS (MISCELLANEOUS) ×1
INTRAOP MONITOR FEE IMPULS NCS (MISCELLANEOUS) ×1
INTRAOP MONITOR FEE IMPULSE (MISCELLANEOUS) ×1
K-WIRE 1.6 NITINOL SHARP TIP (WIRE) ×4
KIT DISP MARS 3V (KITS) IMPLANT
KIT PEDICLE ACCESS (KITS) IMPLANT
KIT SPINAL PRONEVIEW (KITS) ×1 IMPLANT
KIT TURNOVER KIT A (KITS) ×1 IMPLANT
KNIFE BAYONET SHORT DISCETOMY (MISCELLANEOUS) IMPLANT
KWIRE 1.6 NITINOL SHARP TIP (WIRE) IMPLANT
MANIFOLD NEPTUNE II (INSTRUMENTS) ×2 IMPLANT
MARKER SKIN DUAL TIP RULER LAB (MISCELLANEOUS) ×3 IMPLANT
MARKER SPHERE PSV REFLC 13MM (MARKER) ×7 IMPLANT
NDL SAFETY ECLIP 18X1.5 (MISCELLANEOUS) ×1 IMPLANT
PACK LAMINECTOMY NEURO (CUSTOM PROCEDURE TRAY) ×1 IMPLANT
PAD ARMBOARD 7.5X6 YLW CONV (MISCELLANEOUS) ×1 IMPLANT
ROD SPINE CURVE 5.5X55 (Rod) IMPLANT
SCREW CREO 6.5X45 FEN (Screw) IMPLANT
SCREW CREO MIS 30 TULIP (Screw) IMPLANT
SOLUTION IRRIG SURGIPHOR (IV SOLUTION) ×1 IMPLANT
SPACER HEDRON 10D 18X50X11 (Spacer) IMPLANT
SPONGE GAUZE 2X2 8PLY STRL LF (GAUZE/BANDAGES/DRESSINGS) IMPLANT
STAPLER SKIN PROX 35W (STAPLE) IMPLANT
SURGIFLO W/THROMBIN 8M KIT (HEMOSTASIS) ×1 IMPLANT
SUT DVC VLOC 3-0 CL 6 P-12 (SUTURE) ×1 IMPLANT
SUT ETHILON 3-0 FS-10 30 BLK (SUTURE)
SUT VIC AB 0 CT1 27 (SUTURE) ×1
SUT VIC AB 0 CT1 27XCR 8 STRN (SUTURE) ×1 IMPLANT
SUT VIC AB 2-0 CT1 18 (SUTURE) ×1 IMPLANT
SUTURE EHLN 3-0 FS-10 30 BLK (SUTURE) IMPLANT
SYR 30ML LL (SYRINGE) ×2 IMPLANT
TOWEL OR 17X26 4PK STRL BLUE (TOWEL DISPOSABLE) ×5 IMPLANT
TRAP FLUID SMOKE EVACUATOR (MISCELLANEOUS) ×1 IMPLANT
TRAY FOLEY MTR SLVR 16FR STAT (SET/KITS/TRAYS/PACK) IMPLANT
TROCAR INSERT W/PEDICLE NDL (TROCAR) IMPLANT
TROCAR INSERT W/PEDICLE NDLE (TROCAR)
TUBING CONNECTING 10 (TUBING) ×2 IMPLANT
WATER STERILE IRR 1000ML POUR (IV SOLUTION) ×2 IMPLANT
WATER STERILE IRR 500ML POUR (IV SOLUTION) IMPLANT

## 2022-05-22 NOTE — Anesthesia Preprocedure Evaluation (Addendum)
Anesthesia Evaluation  Patient identified by MRN, date of birth, ID band Patient awake    Reviewed: Allergy & Precautions, NPO status , Patient's Chart, lab work & pertinent test results  History of Anesthesia Complications Negative for: history of anesthetic complications  Airway Mallampati: II   Neck ROM: Full    Dental  (+) Chipped   Pulmonary neg pulmonary ROS,    Pulmonary exam normal breath sounds clear to auscultation       Cardiovascular hypertension, Normal cardiovascular exam Rhythm:Regular Rate:Normal  ECG 05/10/22: Normal sinus rhythm Rightward axis   Neuro/Psych Chronic pain    GI/Hepatic negative GI ROS,   Endo/Other  diabetes, Type 2Obesity   Renal/GU Renal disease (nephrolithiasis)     Musculoskeletal   Abdominal   Peds  Hematology negative hematology ROS (+)   Anesthesia Other Findings   Reproductive/Obstetrics                            Anesthesia Physical Anesthesia Plan  ASA: 2  Anesthesia Plan: General   Post-op Pain Management:    Induction: Intravenous  PONV Risk Score and Plan: 2 and Ondansetron, Dexamethasone and Treatment may vary due to age or medical condition  Airway Management Planned: Oral ETT  Additional Equipment:   Intra-op Plan:   Post-operative Plan: Extubation in OR  Informed Consent: I have reviewed the patients History and Physical, chart, labs and discussed the procedure including the risks, benefits and alternatives for the proposed anesthesia with the patient or authorized representative who has indicated his/her understanding and acceptance.     Dental advisory given  Plan Discussed with: CRNA  Anesthesia Plan Comments: (Patient consented for risks of anesthesia including but not limited to:  - adverse reactions to medications - damage to eyes, teeth, lips or other oral mucosa - nerve damage due to positioning  - sore throat  or hoarseness - damage to heart, brain, nerves, lungs, other parts of body or loss of life  Informed patient about role of CRNA in peri- and intra-operative care.  Patient voiced understanding.)        Anesthesia Quick Evaluation

## 2022-05-22 NOTE — Anesthesia Procedure Notes (Signed)
Procedure Name: Intubation Date/Time: 05/22/2022 7:49 AM  Performed by: Lowry Bowl, CRNAPre-anesthesia Checklist: Patient identified, Emergency Drugs available, Suction available and Patient being monitored Patient Re-evaluated:Patient Re-evaluated prior to induction Oxygen Delivery Method: Circle system utilized Preoxygenation: Pre-oxygenation with 100% oxygen Induction Type: IV induction Ventilation: Mask ventilation without difficulty Laryngoscope Size: McGraph and 4 Grade View: Grade I Tube type: Oral Tube size: 7.0 mm Number of attempts: 1 Airway Equipment and Method: Stylet and Video-laryngoscopy Placement Confirmation: ETT inserted through vocal cords under direct vision, positive ETCO2 and breath sounds checked- equal and bilateral Secured at: 22 cm Tube secured with: Tape Dental Injury: Teeth and Oropharynx as per pre-operative assessment

## 2022-05-22 NOTE — Progress Notes (Signed)
Called Dr. Izora Ribas patient voided 200 cc clear yellow urine, but bladder scan showed 675. Per Dr. Izora Ribas since patient has sensation to void, wait to see if patient can empty his bladder. Will continue to monitor

## 2022-05-22 NOTE — Op Note (Signed)
Indications: Mr. 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   Preoperative Note:   Risks of surgery discussed include: infection, bleeding, stroke, coma, death, paralysis, CSF leak, nerve/spinal cord injury, numbness, tingling, weakness, complex regional pain syndrome, recurrent stenosis and/or disc herniation, vascular injury, development of instability, neck/back pain, need for further surgery, persistent symptoms, development of deformity, and the risks of anesthesia. The patient understood these risks and agreed to proceed.  NAME OF ANTERIOR PROCEDURE:               1. Anterior lumbar interbody fusion via a right lateral retroperitoneal approach at L4/5 2. Placement of a Lordotic  Globus Hedron interbody cage, filled with Demineralized Bone Matrix  NAME OF POSTERIOR PROCEDURE 1. Posterior instrumentation using Globus Creo Instrumentation 2. Posterolateral fusion, L4/5 3. Use of Stereotaxis   PROCEDURE:  Patient was brought to the operating room, intubated, turned to the lateral position.  All pressure points were checked and double-checked.  The patient was prepped and draped in the standard fashion. Prior to prepping, fluoroscopy was brought in and the patient was positioned with a large bump under the contralateral side between the iliac crest and rib cage, allowing the area between the iliac crest and the lateral aspect of the rib cage to open and  increase the ability to reach inferiorly, to facilitate entry into the disc space.  The incision was marked upon the skin both the location of the disc space as well as the superior most aspect of the iliac crest.  Based on the identification of the disc space an incision was prepared, marked upon the skin and eventually was used for our lateral incision.  The fluoroscopy was turned into a cross table A/P image in order to confirm that the patient's spine remained in a perpendicular trajectory to the floor without rotation.  Once confirming that all the pressure points were checked and double-checked and the patient remained in sturdy position strapped down in this slightly jack-knifed lateral position, the patient was prepped and draped in standard fashion.  The skin was injected with local anesthetic, then incised until the abdominal wall fascia was noted.  I bluntly dissected posteriorly until we were able to identify the posterior musculature near petit's triangle.  At this point, using primarily blunt dissection with our finger aided with a metzenbaum scissor, were able to enter the retroperitoneal cavity.  The retroperitoneal potential space was opened further until palpating out the psoas muscle, the medial aspect of the iliac crest, the medial aspect of the last rib and continued to define the retroperitoneal space with blunt dissection in order to facilitate safe placement of our dilators.    While protecting by dissecting directly onto a finger in the retroperitoneum, the retroperitoneal space was entered safely from the lateral incision and the initial dilator placed onto the muscle belly of the psoas.  While directly stimulating the dilator and after radiographically confirming our location relative to the disc space, I placed the dilator through the psoas.  The dilators were  stimulated to ensure remaining safely away from any of the lumbar plexus nerves; the dilators were repositioned until no  pathologic stimulation was appreciated.  Once I had confirmed the location of our initial dilator radiographically, a K-wire secured the dilator into the L4/5 disc space and confirmed position under A/P and lateral fluoroscopy.  At this point, I dilated up with direct stimulation to confirm lack of pathologic stimulation.  Once all the dilators were in position, I placed in the retractor and secured it onto the table, locked into position and confirmed under A/P and lateral fluoroscopy to confirm our approach angle to the disc space as well as location relative to the disc space.  I then placed the muscle stimulator in through the working channel down to the vertebral body, stimulating the entire lateral surface of the vertebral body and any of the visualized psoas muscle that was adjacent to the retractor, confirming again the safe passage to the psoas before we began performing the discectomy.  At this point, we began our discectomy at L4/5.  The disc was incised laterally throughout the extent of our exposure. Using a combination of pituitary rongeurs, Kerrison rongeurs, rasps, curettes of various sorts, we were able to begin to clean out the disc space.  Once we had cleaned out the majority of the disc space, we then cut the lateral annulus with a cob, breaking the lateral annual attachments on the contralateral side by subtly working the cob through the annulus while using flouroscopy.  Care was taken not to extend further than required after cutting the annular attachments.  After this had been performed, we prepared the endplates for placement of our graft, sized a graft to the disc space by serially dilating up in trial sizes until we confirmed that our graft would be well positioned, allowing distraction while maintaining good grip.  This was confirmed under A/P and lateral fluoroscopy in order to ensure its placement as an eventual trial for placement of our final graft.  We irrigated with  bacteriostatic saline.  Once confirmed placement, the Hedron implant filled with allograft was impacted into position at L4/5.   Through a combination of intradiscal distraction and anterior releasing, we were able to correct the anterior deformity during disc preparation and placement of the graft.  At this point, final radiographs were performed, and we began closure.  The wound was closed using 0 Vicryl interrupted suture in the fascia and 2-0 Vicryl inverted suture were placed in the subcutaneous tissue and dermis. 3-0 monocryl was used for final closure. Dermabond was used to close the skin.    After closing the anterior part in layers, the patient was repositioned into prone position.  All pressure points were checked and double-checked.  The posterior operative site was prepped and draped in standard fashion.  The stereotactic array was placed.  Stereotactic images were acquired using intraoperative CT scanning.  This was registered to the patient.  Using stereotaxis, screw trajectories were planned and incisions made.  The pedicles from L4 to L5 were cannulated bilaterally and K wires used to secure the tracks.  We then utilized a stereotactic screwdriver to place pedicle screws from L4 to L5.  At each level, 6.5x63mm Globus Creo pedicle screws were placed.  Once the screws were placed, the screw extensions were then linked, a path was formed for the rod and a rod was utilized to connect the screws.  We then compressed, torqued / counter-torqued and removed the screw assembly. Once performed on  each side, the C-arm was brought back and to take confirmatory CT scan showing appropriate placement of all instrumentation and anatomic alignment.    Posterolateral arthrodesis was performed at L4-L5.  Again we confirmed radiographically and began our closure.  The wound was closed using 0 Vicryl interrupted suture in the fascia, 2-0 Vicryl inverted suture were placed in the subcutaneous tissue and  dermis. 3-0 monocryl was used for final closure. Dermabond was used to close the skin.    Needle, lap and all counts were correct at the end of the case.    There was no pathologic change in the neuromonitoring during the procedure.   Geronimo Boot PA assisted in the entire procedure. An assistant was required for this procedure due to the complexity.  The assistant provided assistance in tissue manipulation and suction, and was required for the successful and safe performance of the procedure. I performed the critical portions of the procedure.   Meade Maw MD Neurosurgery

## 2022-05-22 NOTE — Transfer of Care (Signed)
Immediate Anesthesia Transfer of Care Note  Patient: Douglas Gibson  Procedure(s) Performed: L4-5 LATERAL LUMBAR FUSION AND POSTERIOR SPINAL FUSION APPLICATION OF INTRAOPERATIVE CT SCAN  Patient Location: PACU  Anesthesia Type:General  Level of Consciousness: awake, drowsy and patient cooperative  Airway & Oxygen Therapy: Patient Spontanous Breathing and Patient connected to face mask oxygen  Post-op Assessment: Report given to RN and Post -op Vital signs reviewed and stable  Post vital signs: Reviewed and stable  Last Vitals:  Vitals Value Taken Time  BP 182/97 05/22/22 1045  Temp    Pulse 98 05/22/22 1048  Resp 21 05/22/22 1048  SpO2 99 % 05/22/22 1048  Vitals shown include unvalidated device data.  Last Pain:  Vitals:   05/22/22 0641  PainSc: 8       Patients Stated Pain Goal: 0 (86/38/17 7116)  Complications: No notable events documented.

## 2022-05-22 NOTE — H&P (Signed)
Referring Physician:  No referring provider defined for this encounter.  Primary Physician:  Mick SellFitzgerald, David P, MD  History of Present Illness: 05/22/2022 Mr. Douglas Gibson is here today with a chief complaint of back and leg pain as shown below.  04/11/2022 Mr. Douglas MissDanny Gibson is here today with a chief complaint of bilateral low back pain that radiates into the bilateral buttocks. Also complains of numbness that will get the entire right leg and intermittent numbness and tingling in the feet.    Been having worsening issues over the past 15 years.  His back and leg discomfort is now impacting his day-to-day life.  By the end of the day, he has significant numbness and discomfort in his legs.  He cannot stand in 1 position for as long as he would like due to discomfort.  Standing, walking, bending, and lifting make his pain worse.  It can be as bad as 8 out of 10.  He gets sharp discomfort into his buttocks and stiff and aching discomfort elsewhere.  Meloxicam has helped.   He has tried physical therapy and injections without improvement.   Bowel/Bladder Dysfunction: none   Conservative measures:  Physical therapy:  has participated in at [facility] including regular antiinflammatories: meloxicam Injections: has had epidural steroid injections 02/07/2022: MBB to the bilateral L4-5 and L5-S1 facet joints (8/10 to no relief) 12/13/2021: Bilateral S1 transforaminal ESI (questionable relief for 1 week)   Past Surgery: denies   Douglas Gibson has no symptoms of cervical myelopathy.   The symptoms are causing a significant impact on the patient's life.   Review of Systems:  A 10 point review of systems is negative, except for the pertinent positives and negatives detailed in the HPI.  Past Medical History: Past Medical History:  Diagnosis Date   Family history of adverse reaction to anesthesia    father had stroke coming out of anesthesia at the age of 61    History of kidney stones    Hypertension     Past Surgical History: History reviewed. No pertinent surgical history.  Allergies: Allergies as of 04/14/2022   (No Known Allergies)    Medications: No outpatient medications have been marked as taking for the 05/22/22 encounter [facility](Hospital Encounter).    Social History: Social History   Tobacco Use   Smoking status: Never   Smokeless tobacco: Never  Vaping Use   Vaping Use: Never used  Substance Use Topics   Alcohol use: Yes    Alcohol/week: 1.0 standard drink of alcohol    Types: 1 Standard drinks or equivalent per week    Comment: ocassionally   Drug use: Never    Family Medical History: History reviewed. No pertinent family history.  Physical Examination: Vitals:   05/22/22 0641  BP: (!) 161/92  Pulse: 62  Resp: 16  Temp: 98.5 F (36.9 C)   Heart sounds normal no MRG. Chest Clear to Auscultation Bilaterally.   General: Patient is well developed, well nourished, calm, collected, and in no apparent distress. Attention to examination is appropriate.  Neck:   Supple.  Full range of motion.  Respiratory: Patient is breathing without any difficulty.   NEUROLOGICAL:     Awake, alert, oriented to person, place, and time.  Speech is clear and fluent. Fund of knowledge is appropriate.   Cranial Nerves: Pupils equal round and reactive to light.  Facial tone is symmetric.  Facial sensation is symmetric. Shoulder shrug is symmetric. Tongue protrusion is midline.  There is no pronator drift.  ROM of spine: full.    Strength: Side Biceps Triceps Deltoid Interossei Grip Wrist Ext. Wrist Flex.  R 5 5 5 5 5 5 5   L 5 5 5 5 5 5 5    Side Iliopsoas Quads Hamstring PF DF EHL  R 5 5 5 5 5 5   L 5 5 5 5 5 5    Reflexes are 1+ and symmetric at the biceps, triceps, brachioradialis, patella and achilles.   Hoffman's is absent.   Bilateral upper and lower extremity sensation is intact to light touch.    No evidence of dysmetria  noted.  Gait is normal.     Medical Decision Making  Imaging: MRI L spine 10/22/2021 L3-4: Mild bilateral foraminal stenosis due to disc bulge and facet arthropathy.   L4-5: Prominent central narrowing of the thecal sac with moderate bilateral foraminal stenosis and moderate bilateral subarticular lateral recess stenosis due to disc uncovering, disc bulge, and degenerative facet arthropathy. Left foraminal annular tear noted. AP diameter of the thecal sac is 0.6 cm^2 and the cross-sectional area of the thecal sac is 0.3 cm^2   L5-S1: Mild bilateral foraminal stenosis and borderline right subarticular lateral recess stenosis due to disc bulge, intervertebral spurring, and facet arthropathy.   IMPRESSION: 1. Lumbar spondylosis and degenerative disc disease, causing prominent impingement at L4-5 and mild impingement at L2-3, L3-4, and L5-S1, as detailed above. Anterolisthesis at L4-5 is also contributory to the impingement at that level. 2. Degenerative facet edema bilaterally at L4-5 and to a lesser extent at L3-4.     Electronically Signed   By: Van Clines M.D.   On: 10/23/2021 13:34   Flexion-extension x-rays of the lumbar spine on April 11, 2022 show anterolisthesis at L4-5.  This is 5 and half millimeters on extension increasing to 11.3 mm on flexion.   I have personally reviewed the images and agree with the above interpretation.  Assessment and Plan: Mr. Douglas Gibson is a pleasant 61 y.o. male with  anterolisthesis of L4 and L5 with severe facet arthrosis.  He has severe stenosis at L4-5 due to this.  He has 5 mm of change in his anterolisthesis between flexion and extension.  This suggests instability contributing to his current symptoms.   He has tried and failed conservative management including physical therapy and injections.  At this point, no further conservative management is indicated.   Given the anterolisthesis and facet arthrosis which results in  abnormality in the L4-5 facet joints bilaterally and dislocation of the inferior articulating process with relation to the superior articulating process, surgical fusion will be necessary to reestablish a normal spinal canal diameter due to implied and inherent instability.   We will proceed with L4-5 lateral lumbar interbody fusion with percutaneous fixation.  Would like to get flexion-extension x-rays today to determine whether lateral interbody fusion versus transforaminal interbody fusion is the most appropriate option.  With an anterolisthesis, I normally favor a lateral lumbar interbody fusion for anterior column reconstruction followed by percutaneous fixation.   Thank you for involving me in the care of this patient.      Demarquis Osley K. Izora Ribas MD, [facility]

## 2022-05-23 ENCOUNTER — Telehealth: Payer: Self-pay

## 2022-05-23 ENCOUNTER — Encounter: Payer: BC Managed Care – PPO | Admitting: Orthopedic Surgery

## 2022-05-23 ENCOUNTER — Encounter: Payer: Self-pay | Admitting: Neurosurgery

## 2022-05-23 MED ORDER — METHOCARBAMOL 500 MG PO TABS: 500.0000 mg | ORAL_TABLET | Freq: Four times a day (QID) | ORAL | 0 refills | Status: DC | PRN

## 2022-05-23 MED ORDER — CELECOXIB 200 MG PO CAPS: 200.0000 mg | ORAL_CAPSULE | Freq: Two times a day (BID) | ORAL | 0 refills | Status: DC

## 2022-05-23 MED ORDER — OXYCODONE HCL 5 MG PO TABS: 5.0000 mg | ORAL_TABLET | ORAL | 0 refills | Status: AC | PRN

## 2022-05-23 NOTE — Plan of Care (Signed)

## 2022-05-23 NOTE — TOC Initial Note (Signed)
Transition of Care [facility]) - Initial/Assessment Note    Patient Details  Name: Douglas Gibson MRN: [id number] Date of Birth: 09/12/1960  Transition of Care [facility]) CM/SW Contact:    Conception Oms, RN Phone Number: 05/23/2022, 9:46 AM  Clinical Narrative:                     Transition of Care (TOC) Screening Note   Patient Details  Name: Douglas Gibson Date of Birth: [birth year]   Transition of Care [facility]) CM/SW Contact:    Conception Oms, RN Phone Number: 05/23/2022, 9:47 AM    Transition of Care Department [facility]) has reviewed patient and no TOC needs have been identified at this time. We will continue to monitor patient advancement through interdisciplinary progression rounds. If new patient transition needs arise, please place a TOC consult.       Patient Goals and CMS Choice        Expected Discharge Plan and Services           Expected Discharge Date: 05/23/22                                    Prior Living Arrangements/Services                       Activities of Daily Living Home Assistive Devices/Equipment: Eyeglasses ADL Screening (condition at time of admission) Patient's cognitive ability adequate to safely complete daily activities?: Yes Is the patient deaf or have difficulty hearing?: No Does the patient have difficulty seeing, even when wearing glasses/contacts?: No Does the patient have difficulty concentrating, remembering, or making decisions?: No Patient able to express need for assistance with ADLs?: Yes Does the patient have difficulty dressing or bathing?: No Independently performs ADLs?: Yes (appropriate for developmental age) Does the patient have difficulty walking or climbing stairs?: Yes Weakness of Legs: None Weakness of Arms/Hands: None  Permission Sought/Granted                  Emotional Assessment              Admission diagnosis:  Spondylolisthesis of lumbar region [M43.16] Patient Active  Problem List   Diagnosis Date Noted   Spondylolisthesis at L4-L5 level 05/22/2022   Chronic bilateral low back pain with bilateral sciatica 05/22/2022   Spondylolisthesis of lumbar region 05/22/2022   PCP:  Leonel Ramsay, MD Pharmacy:   [facility] [place], [place] [address] [place] [address] Phone: [phone] Fax: [phone]     Social Determinants of Health (SDOH) Interventions Housing Interventions: Patient Refused  Readmission Risk Interventions     No data to display

## 2022-05-23 NOTE — Progress Notes (Signed)
OT Cancellation Note  Patient Details Name: Douglas Gibson MRN: [id number] DOB: 1961/07/02   Cancelled Treatment:    Reason Eval/Treat Not Completed: OT screened, no needs identified, will sign off. Pt up and ambulating with PT and reports fully dressing himself prior to arrival. OT did review precautions with pt for self care tasks. He does not feel that he needs any further assessment at this time. OT to complete orders and sign off.   Darleen Crocker, MS, OTR/L , CBIS ascom [phone]  05/23/22, 8:59 AM

## 2022-05-23 NOTE — Progress Notes (Signed)
    Attending Progress Note  History: Douglas Gibson is here for anterolisthesis  POD1: Doing well. Pain under control  Physical Exam: Vitals:   05/23/22 0041 05/23/22 0459  BP: 123/61 121/67  Pulse: 62 71  Resp: 17 17  Temp: (!) 97.5 F (36.4 C) 97.6 F (36.4 C)  SpO2: 99% 100%    AA Ox3 CNI  Strength:5/5 throughout BLE Dressings c/d/I   Data:  No results for input(s): "NA", "K", "CL", "CO2", "BUN", "CREATININE", "LABGLOM", "GLUCOSE", "CALCIUM" in the last 168 hours. No results for input(s): "AST", "ALT", "ALKPHOS" in the last 168 hours.  Invalid input(s): "TBILI"   No results for input(s): "WBC", "HGB", "HCT", "PLT" in the last 168 hours. No results for input(s): "APTT", "INR" in the last 168 hours.       Other tests/results: n/a  Assessment/Plan:  Douglas Gibson is doing well after L4-5 XLIF/PSF for anterolisthesis  - mobilize - pain control - DVT prophylaxis - PTOT   Meade Maw MD, [facility] Department of Neurosurgery

## 2022-05-23 NOTE — Anesthesia Postprocedure Evaluation (Signed)
Anesthesia Post Note  Patient: HEZAKIAH CHAMPEAU  Procedure(s) Performed: L4-5 LATERAL LUMBAR FUSION AND POSTERIOR SPINAL FUSION APPLICATION OF INTRAOPERATIVE CT SCAN  Patient location during evaluation: PACU Anesthesia Type: General Level of consciousness: awake and alert Pain management: pain level controlled Vital Signs Assessment: post-procedure vital signs reviewed and stable Respiratory status: spontaneous breathing, nonlabored ventilation, respiratory function stable and patient connected to nasal cannula oxygen Cardiovascular status: blood pressure returned to baseline and stable Postop Assessment: no apparent nausea or vomiting Anesthetic complications: no   No notable events documented.   Last Vitals:  Vitals:   05/23/22 0041 05/23/22 0459  BP: 123/61 121/67  Pulse: 62 71  Resp: 17 17  Temp: (!) 36.4 C 36.4 C  SpO2: 99% 100%    Last Pain:  Vitals:   05/22/22 2228  TempSrc:   PainSc: 3                  Martha Clan

## 2022-05-23 NOTE — Discharge Summary (Signed)
Physician Discharge Summary  Patient ID: Douglas Gibson MRN: [id number] DOB/AGE: 17-Oct-1960 89 y.o.  Admit date: 05/22/2022 Discharge date: 05/23/2022  Admission Diagnoses: Principal Problem:   Spondylolisthesis of lumbar region Active Problems:   Spondylolisthesis at L4-L5 level   Chronic bilateral low back pain with bilateral sciatica  Discharge Diagnoses:  Principal Problem:   Spondylolisthesis of lumbar region Active Problems:   Spondylolisthesis at L4-L5 level   Chronic bilateral low back pain with bilateral sciatica   Discharged Condition: good  Hospital Course: Douglas Gibson is a 61 yo male who presented with anterolisthesis.  He underwent surgical fixation, from which he did well.  He was ambulatory on POD1 with well-controlled pain.  Consults: None  Significant Diagnostic Studies: radiology: X-Ray: correct placement in surgery  Treatments: surgery: L4-5 XLIF/PSF  Discharge Exam: Blood pressure 121/67, pulse 71, temperature 97.6 F (36.4 C), resp. rate 17, height 6' (1.829 m), weight 110.6 kg, SpO2 100 %. General appearance: alert and cooperative CNI MAEW 5/5   Disposition: Discharge disposition: 01-Home or Self Care       Discharge Instructions     Discharge patient   Complete by: As directed    Discharge disposition: 01-Home or Self Care   Discharge patient date: 05/23/2022   Incentive spirometry RT   Complete by: As directed       Allergies as of 05/23/2022   No Known Allergies      Medication List     TAKE these medications    celecoxib 200 MG capsule Commonly known as: CELEBREX Take 1 capsule (200 mg total) by mouth every 12 (twelve) hours.   hydrochlorothiazide 12.5 MG tablet Commonly known as: HYDRODIURIL Take 12.5 mg by mouth daily.   losartan 100 MG tablet Commonly known as: COZAAR Take 100 mg by mouth daily.   methocarbamol 500 MG tablet Commonly known as: ROBAXIN Take 1 tablet (500 mg total) by mouth every 6 (six) hours  as needed for muscle spasms.   oxyCODONE 5 MG immediate release tablet Commonly known as: Oxy IR/ROXICODONE Take 1-2 tablets (5-10 mg total) by mouth every 3 (three) hours as needed for up to 7 days for moderate pain ((score 4 to 6)).        Follow-up Information     Geronimo Boot, PA-C Follow up on 06/02/2022.   Specialty: Neurosurgery Why: 1030 Contact information: [address] [place] [address] [phone]                 Signed: Meade Maw 05/23/2022, 8:18 AM

## 2022-05-23 NOTE — Evaluation (Signed)
Physical Therapy Evaluation Patient Details Name: Douglas Gibson MRN: [id number] DOB: 1960-12-18 Today's Date: 05/23/2022  History of Present Illness  61 y/o male s/p L4-5 fusion 10/9.  Clinical Impression  Pt did well with PT exam and showed good mobility and minimal pain during activity.  He continues to have some proximal R LE weakness but was functional and able to ascend/descend steps w/o UEs (educated on appropriate step-to strategy).  Pt endorses understanding of precautions and limitations.  Pt safe to return home, does not require AD, future PT per surgeon.       Recommendations for follow up therapy are one component of a multi-disciplinary discharge planning process, led by the attending physician.  Recommendations may be updated based on patient status, additional functional criteria and insurance authorization.  Follow Up Recommendations Follow physician's recommendations for discharge plan and follow up therapies      Assistance Recommended at Discharge Intermittent Supervision/Assistance  Patient can return home with the following       Equipment Recommendations None recommended by PT  Recommendations for Other Services       Functional Status Assessment Patient has had a recent decline in their functional status and demonstrates the ability to make significant improvements in function in a reasonable and predictable amount of time.     Precautions / Restrictions Precautions Precautions: Back Restrictions Weight Bearing Restrictions: No      Mobility  Bed Mobility               General bed mobility comments: in recliner, reports getting up w/o issue    Transfers Overall transfer level: Independent Equipment used: None               General transfer comment: pt was able to rise to standing w/o assist, good overall confidence and safety    Ambulation/Gait Ambulation/Gait assistance: Supervision Gait Distance (Feet): 200 Feet Assistive  device: None         General Gait Details: Pt able to maintain consistent and confident cadence with good safety, no UE reliance or increased pain  Stairs Stairs: Yes Stairs assistance: Supervision Stair Management: No rails, One rail Left Number of Stairs: 4    Wheelchair Mobility    Modified Rankin (Stroke Patients Only)       Balance                                             Pertinent Vitals/Pain Pain Assessment Pain Assessment: 0-10 Pain Score: 2  Pain Location: pressure in surgical area    Home Living Family/patient expects to be discharged to:: Private residence Living Arrangements: Spouse/significant other Available Help at Discharge: Family (wife home most of the time)   Home Access: Stairs to enter Entrance Stairs-Rails: None Entrance Stairs-Number of Steps: 1     Home Equipment: None      Prior Function Prior Level of Function : Independent/Modified Independent;Working/employed;Driving             Mobility Comments: Pt drives a truck, does lift, bend, etc       Hand Dominance        Extremity/Trunk Assessment   Upper Extremity Assessment Upper Extremity Assessment: Overall WFL for tasks assessed    Lower Extremity Assessment Lower Extremity Assessment:  (WNL except R hip flexion 3/5, R knee ext 4/5)       Communication  Communication: No difficulties  Cognition Arousal/Alertness: Awake/alert Behavior During Therapy: WFL for tasks assessed/performed Overall Cognitive Status: Within Functional Limits for tasks assessed                                          General Comments      Exercises     Assessment/Plan    PT Assessment Patient does not need any further PT services  PT Problem List         PT Treatment Interventions      PT Goals (Current goals can be found in the Care Plan section)  Acute Rehab PT Goals Patient Stated Goal: go home PT Goal Formulation: All assessment  and education complete, DC therapy    Frequency       Co-evaluation               AM-PAC PT "6 Clicks" Mobility  Outcome Measure Help needed turning from your back to your side while in a flat bed without using bedrails?: None Help needed moving from lying on your back to sitting on the side of a flat bed without using bedrails?: None Help needed moving to and from a bed to a chair (including a wheelchair)?: None Help needed standing up from a chair using your arms (e.g., wheelchair or bedside chair)?: None Help needed to walk in hospital room?: None Help needed climbing 3-5 steps with a railing? : None 6 Click Score: 24    End of Session   Activity Tolerance: Patient tolerated treatment well Patient left: in chair;with call bell/phone within reach   PT Visit Diagnosis: Pain;Muscle weakness (generalized) (M62.81) Pain - Right/Left: Right Pain - part of body:  (lumbago)    Time: 9485-4627 PT Time Calculation (min) (ACUTE ONLY): 15 min   Charges:   PT Evaluation $PT Eval Low Complexity: 1 Low          Malachi Pro, DPT 05/23/2022, 11:02 AM

## 2022-05-23 NOTE — Discharge Instructions (Signed)
Your surgeon has performed an operation on your lumbar spine (low back) to relieve pressure on one or more nerves. Many times, patients feel better immediately after surgery and can "overdo it." Even if you feel well, it is important that you follow these activity guidelines. If you do not let your back heal properly from the surgery, you can increase the chance of a disc herniation and/or return of your symptoms. The following are instructions to help in your recovery once you have been discharged from the hospital.  * It is ok to take celebrex after surgery.  Activity    No bending, lifting, or twisting ("BLT"). Avoid lifting objects heavier than 10 pounds (gallon milk jug).  Where possible, avoid household activities that involve lifting, bending, pushing, or pulling such as laundry, vacuuming, grocery shopping, and childcare. Try to arrange for help from friends and family for these activities while your back heals.  Increase physical activity slowly as tolerated.  Taking short walks is encouraged, but avoid strenuous exercise. Do not jog, run, bicycle, lift weights, or participate in any other exercises unless specifically allowed by your doctor. Avoid prolonged sitting, including car rides.  Talk to your doctor before resuming sexual activity.  You should not drive until cleared by your doctor.  Until released by your doctor, you should not return to work or school.  You should rest at home and let your body heal.   You may shower two days after your surgery.  After showering, lightly dab your incision dry. Do not take a tub bath or go swimming for 3 weeks, or until approved by your doctor at your follow-up appointment.  If you smoke, we strongly recommend that you quit.  Smoking has been proven to interfere with normal healing in your back and will dramatically reduce the success rate of your surgery. Please contact [facility] (800-QUIT-NOW) and use the resources at www.[facility].com for  assistance in stopping smoking.  Surgical Incision   If you have a dressing on your incision, you may remove it three days after your surgery (05/25/22). Keep your incision area clean and dry.  If you have staples or stitches on your incision, you should have a follow up scheduled for removal. If you do not have staples or stitches, you will have steri-strips (small pieces of surgical tape) or Dermabond glue. The steri-strips/glue should begin to peel away within about a week (it is fine if the steri-strips fall off before then). If the strips are still in place one week after your surgery, you may gently remove them.  Diet            You may return to your usual diet. Be sure to stay hydrated.  When to Contact us  Although your surgery and recovery will likely be uneventful, you may have some residual numbness, aches, and pains in your back and/or legs. This is normal and should improve in the next few weeks.  However, should you experience any of the following, contact us immediately: New numbness or weakness Pain that is progressively getting worse, and is not relieved by your pain medications or rest Bleeding, redness, swelling, pain, or drainage from surgical incision Chills or flu-like symptoms Fever greater than 101.0 F (38.3 C) Problems with bowel or bladder functions Difficulty breathing or shortness of breath Warmth, tenderness, or swelling in your calf  Contact Information During office hours (Monday-Friday 9 am to 5 pm), please call your physician at [phone] and ask for Sharlot Gowda After hours  and weekends, please call [phone] and speak with the neurosurgeon on call For a life-threatening emergency, call 911

## 2022-05-23 NOTE — Telephone Encounter (Signed)
Celebrex auth submitted through [employer]. Pending clinical review.  Key: [id number] - PA Case ID: [id number] - Rx #: [id number]

## 2022-05-23 NOTE — Plan of Care (Signed)
Problem: Education: Goal: Ability to verbalize activity precautions or restrictions will improve 05/23/2022 1044 by Dreshaun Stene, Alfredo Batty, RN Outcome: Adequate for Discharge 05/23/2022 0827 by Alferd Apa, RN Outcome: Progressing Goal: Knowledge of the prescribed therapeutic regimen will improve 05/23/2022 1044 by Alferd Apa, RN Outcome: Adequate for Discharge 05/23/2022 0827 by Alferd Apa, RN Outcome: Progressing Goal: Understanding of discharge needs will improve 05/23/2022 1044 by Alferd Apa, RN Outcome: Adequate for Discharge 05/23/2022 0827 by Alferd Apa, RN Outcome: Progressing   Problem: Activity: Goal: Ability to avoid complications of mobility impairment will improve 05/23/2022 1044 by Alferd Apa, RN Outcome: Adequate for Discharge 05/23/2022 0827 by Alferd Apa, RN Outcome: Progressing Goal: Ability to tolerate increased activity will improve 05/23/2022 1044 by Jaquala Fuller, Alfredo Batty, RN Outcome: Adequate for Discharge 05/23/2022 0827 by Alferd Apa, RN Outcome: Progressing Goal: Will remain free from falls 05/23/2022 1044 by Alferd Apa, RN Outcome: Adequate for Discharge 05/23/2022 0827 by Alferd Apa, RN Outcome: Progressing   Problem: Bowel/Gastric: Goal: Gastrointestinal status for postoperative course will improve 05/23/2022 1044 by Alferd Apa, RN Outcome: Adequate for Discharge 05/23/2022 0827 by Alferd Apa, RN Outcome: Progressing   Problem: Clinical Measurements: Goal: Ability to maintain clinical measurements within normal limits will improve 05/23/2022 1044 by Alferd Apa, RN Outcome: Adequate for Discharge 05/23/2022 0827 by Alferd Apa, RN Outcome: Progressing Goal: Postoperative complications will be avoided or minimized 05/23/2022 1044 by Alferd Apa, RN Outcome: Adequate for Discharge 05/23/2022 0827 by Alferd Apa, RN Outcome: Progressing Goal: Diagnostic test results will improve 05/23/2022 1044 by Alferd Apa,  RN Outcome: Adequate for Discharge 05/23/2022 0827 by Alferd Apa, RN Outcome: Progressing   Problem: Pain Management: Goal: Pain level will decrease 05/23/2022 1044 by Alferd Apa, RN Outcome: Adequate for Discharge 05/23/2022 0827 by Alferd Apa, RN Outcome: Progressing   Problem: Skin Integrity: Goal: Will show signs of wound healing 05/23/2022 1044 by Alferd Apa, RN Outcome: Adequate for Discharge 05/23/2022 0827 by Alferd Apa, RN Outcome: Progressing   Problem: Health Behavior/Discharge Planning: Goal: Identification of resources available to assist in meeting health care needs will improve 05/23/2022 1044 by Pete Merten, Alfredo Batty, RN Outcome: Adequate for Discharge 05/23/2022 0827 by Alferd Apa, RN Outcome: Progressing   Problem: Bladder/Genitourinary: Goal: Urinary functional status for postoperative course will improve 05/23/2022 1044 by Alferd Apa, RN Outcome: Adequate for Discharge 05/23/2022 0827 by Alferd Apa, RN Outcome: Progressing   Problem: Education: Goal: Knowledge of General Education information will improve Description: Including pain rating scale, medication(s)/side effects and non-pharmacologic comfort measures 05/23/2022 1044 by Alferd Apa, RN Outcome: Adequate for Discharge 05/23/2022 0827 by Alferd Apa, RN Outcome: Progressing   Problem: Health Behavior/Discharge Planning: Goal: Ability to manage health-related needs will improve 05/23/2022 1044 by Alferd Apa, RN Outcome: Adequate for Discharge 05/23/2022 0827 by Alferd Apa, RN Outcome: Progressing   Problem: Clinical Measurements: Goal: Ability to maintain clinical measurements within normal limits will improve 05/23/2022 1044 by Alferd Apa, RN Outcome: Adequate for Discharge 05/23/2022 0827 by Alferd Apa, RN Outcome: Progressing Goal: Will remain free from infection 05/23/2022 1044 by Alferd Apa, RN Outcome: Adequate for Discharge 05/23/2022 0827 by  Alferd Apa, RN Outcome: Progressing Goal: Diagnostic test results will improve 05/23/2022 1044 by Alferd Apa, RN Outcome: Adequate for Discharge 05/23/2022 0827 by Alferd Apa, RN Outcome: Progressing  Goal: Respiratory complications will improve 05/23/2022 1044 by Orma Render, RN Outcome: Adequate for Discharge 05/23/2022 0827 by Orma Render, RN Outcome: Progressing Goal: Cardiovascular complication will be avoided 05/23/2022 1044 by Orma Render, RN Outcome: Adequate for Discharge 05/23/2022 0827 by Orma Render, RN Outcome: Progressing   Problem: Activity: Goal: Risk for activity intolerance will decrease 05/23/2022 1044 by Orma Render, RN Outcome: Adequate for Discharge 05/23/2022 0827 by Orma Render, RN Outcome: Progressing   Problem: Nutrition: Goal: Adequate nutrition will be maintained 05/23/2022 1044 by Orma Render, RN Outcome: Adequate for Discharge 05/23/2022 0827 by Orma Render, RN Outcome: Progressing   Problem: Coping: Goal: Level of anxiety will decrease 05/23/2022 1044 by Orma Render, RN Outcome: Adequate for Discharge 05/23/2022 0827 by Orma Render, RN Outcome: Progressing   Problem: Elimination: Goal: Will not experience complications related to bowel motility 05/23/2022 1044 by Orma Render, RN Outcome: Adequate for Discharge 05/23/2022 0827 by Orma Render, RN Outcome: Progressing Goal: Will not experience complications related to urinary retention 05/23/2022 1044 by Orma Render, RN Outcome: Adequate for Discharge 05/23/2022 0827 by Orma Render, RN Outcome: Progressing   Problem: Pain Managment: Goal: General experience of comfort will improve 05/23/2022 1044 by Orma Render, RN Outcome: Adequate for Discharge 05/23/2022 0827 by Orma Render, RN Outcome: Progressing   Problem: Safety: Goal: Ability to remain free from injury will improve 05/23/2022 1044 by Orma Render, RN Outcome: Adequate for  Discharge 05/23/2022 0827 by Orma Render, RN Outcome: Progressing   Problem: Skin Integrity: Goal: Risk for impaired skin integrity will decrease 05/23/2022 1044 by Orma Render, RN Outcome: Adequate for Discharge 05/23/2022 0827 by Orma Render, RN Outcome: Progressing

## 2022-05-24 ENCOUNTER — Telehealth: Payer: Self-pay

## 2022-05-24 DIAGNOSIS — R300 Dysuria: Secondary | ICD-10-CM

## 2022-05-24 MED ORDER — CELECOXIB 200 MG PO CAPS: 200.0000 mg | ORAL_CAPSULE | Freq: Two times a day (BID) | ORAL | 0 refills | Status: DC

## 2022-05-24 NOTE — Telephone Encounter (Addendum)
Celebrex authorization request was denied, as they want the patient to try and fail 2 other NSAIDS (despite that I put a note in the clinicals I submitted about why other NSAIDS are not appropriate in this situation). Douglas Gibson has already picked up the rx from [place] using [facility]. [facility] have notified [facility] to cancel the rx on file. We can send future refills to [facility]. Douglas Gibson is aware of the above.

## 2022-05-24 NOTE — Telephone Encounter (Signed)
I spoke with Douglas Gibson and relayed your/Dr Rhea Bleacher recommendations.   **by the way, he has a history of kidney stones, but states this feels different and feels more like pressure in his bladder and his belly is bloated/swollen.  He is trying to walk around as much as possible. He took the celebrex about 30 minutes ago and already feels like it is helping some. He is going to try the magnesium citrate. I encouraged him to call us if he needs anything and to call [phone] and ask them to page Dr Izora Ribas if needed after hours or proceed to the ER if the urinary symptoms worsen.

## 2022-05-24 NOTE — Telephone Encounter (Signed)
Okay. Thank you.

## 2022-05-24 NOTE — Telephone Encounter (Signed)
Discussed with Dr. Izora Ribas.   He needs to make sure he is drinking enough to void. Once he has a BM, this should improve.   May have to limit narcotics short term for this to happen.   If he is feeling the urge to void and he cannot go and he is uncomfortable, then he will need to go to ED.   Agree with everything else you told him about constipation and pain meds.

## 2022-05-24 NOTE — Telephone Encounter (Signed)
-----   Message from Peggyann Shoals sent at 05/24/2022  9:03 AM EDT ----- Regarding: postop pain Contact: [phone] L4-5 XLIF/PSF on 05/22/22 He was not able to sleep last night. He is only urinating a little not normal and is constipated. He was up all night trying to go both ways. Can you recommend anything? His stomach feels very swollen.

## 2022-05-24 NOTE — Telephone Encounter (Signed)
I spoke with Douglas Gibson again around 4:45pm. It had been about 3 hours since he took the whole bottle of magnesium citrate and had not yet had a bowel movement. He is still passing gas. His wife was going to pick up some suppositories to try this evening if he didn't have a bowel movement soon. His urinary issues had not yet improved.

## 2022-05-24 NOTE — Telephone Encounter (Signed)
I spoke with Mr Lech. He has concerns of urinary issues, constipation, increased pain. He was very uncomfortable at home last night.  He reports that last night he was getting up every hour to pee, but only dribbling - this was going on at the hospital, but not as bad as it is today. He is not drinking water because he is so bloated he is afraid it will get worse.  In regards to the constipation, he is passing gas. He is taking dulcolax. I advised him and his wife to pick up magnesium citrate and if this doesn't work, to try a suppository and/or enema. We discussed red flags that would warrant going to the ER (rigid abdomen, severe abdominal pain, vomiting).  They have not been able to pick up the Celebrex from [facility] yet, as it is still pending insurance approval and the cash price is expensive. I have explained how to use the Goodrx app and I have resent the prescription to [facility], as it is only $10.94 there. I left a voicemail at [facility] requesting they process the prescription ASAP without insurance, as they plan to use Goodrx.  He is not taking tylenol. He will add in extra strength tylenol (following the directions on the bottle) to his regimen to see if this helps with the pain.  I will discuss the urinary issues with Stacy and/or Dr Izora Ribas and contact the patient after. I have advised they call us back tomorrow if the above changes have not helped.

## 2022-05-24 NOTE — Telephone Encounter (Signed)
Douglas Gibson is still pending. I spoke with the patient and he has elected to fill the rx at [facility] using Goodrx at this time to avoid waiting any longer due to the pain he is in. I will continue to follow the authorization request so that if approved, it will be on file for future refills.

## 2022-05-25 NOTE — Telephone Encounter (Signed)
I spoke with Mr Douglas Gibson. He reports he had a bowel movement at 7am and has several of them since then.  He reports he is still having trouble emptying his bladder. He has increased his fluid intake. If he drinks lots of water, it will come out quicker, but he has to push to get more urine out of his bladder. He was able to urinate when he woke up this morning without issue. But through the day, he has the urge, but has to concentrate, relax, and it only dribbles out.  I have discussed this with Dr Izora Ribas and we will place an urgent referral to urology.  Mr Douglas Gibson is in agreement with this plan.  In regards to pain, he is taking 1 oxycodone every 4 hours and is thinking of cutting back b/c the celebrex helps. He is still unable to lift his right leg more than half an inch to put shorts on when sitting\. This is not new. Per Dr Izora Ribas, we will likely refer him to PT when he sees Marzetta Board if this is not improving.

## 2022-05-25 NOTE — Addendum Note (Signed)
Addended by: Berdine Addison on: 05/25/2022 02:51 PM   Modules accepted: Orders

## 2022-05-26 ENCOUNTER — Ambulatory Visit: Payer: BC Managed Care – PPO | Admitting: Urology

## 2022-05-26 ENCOUNTER — Encounter: Payer: Self-pay | Admitting: Urology

## 2022-05-26 VITALS — BP 162/89 | HR 76 | Ht 72.0 in | Wt 240.0 lb

## 2022-05-26 DIAGNOSIS — N401 Enlarged prostate with lower urinary tract symptoms: Secondary | ICD-10-CM

## 2022-05-26 DIAGNOSIS — R338 Other retention of urine: Secondary | ICD-10-CM

## 2022-05-26 LAB — URINALYSIS, COMPLETE
Bilirubin, UA: NEGATIVE
Glucose, UA: NEGATIVE
Ketones, UA: NEGATIVE
Leukocytes,UA: NEGATIVE
Nitrite, UA: NEGATIVE
Protein,UA: NEGATIVE
RBC, UA: NEGATIVE
Specific Gravity, UA: 1.02 (ref 1.005–1.030)
Urobilinogen, Ur: 0.2 mg/dL (ref 0.2–1.0)
pH, UA: 7 (ref 5.0–7.5)

## 2022-05-26 LAB — MICROSCOPIC EXAMINATION: Bacteria, UA: NONE SEEN

## 2022-05-26 LAB — BLADDER SCAN AMB NON-IMAGING: Scan Result: 497

## 2022-05-26 MED ORDER — TAMSULOSIN HCL 0.4 MG PO CAPS: 0.4000 mg | ORAL_CAPSULE | Freq: Every day | ORAL | 0 refills | Status: DC

## 2022-05-26 NOTE — Progress Notes (Signed)
05/26/2022 11:54 AM   Douglas Gibson 12-28-1960 [id number]  Referring provider: Meade Maw, [place],  [place] [address]  Chief Complaint  Patient presents with   Other    HPI: Douglas Gibson is a 61 y.o. male referred for evaluation of voiding difficulty.  Status post L4/5 spine surgery 05/22/2022 No Foley catheter operatively Discharged 05/23/2022 and contacted Dr. Nelly Laurence office 05/24/2022 complaining of voiding only minimal amounts and constipation Constipation resolved with laxative however continues with urinary hesitancy, straining to void with only dribbling and no stream.  Attempting to void every 30-60 minutes Prior to hospitalization he had no voiding complaints or prior urologic history   PMH: Past Medical History:  Diagnosis Date   Family history of adverse reaction to anesthesia    father had stroke coming out of anesthesia at the age of 26   History of kidney stones    Hypertension     Surgical History: Past Surgical History:  Procedure Laterality Date   ANTERIOR LATERAL LUMBAR FUSION WITH PERCUTANEOUS SCREW 1 LEVEL N/A 05/22/2022   Procedure: L4-5 LATERAL LUMBAR FUSION AND POSTERIOR SPINAL FUSION;  Surgeon: Meade Maw, MD;  Location: [facility] ORS;  Service: Neurosurgery;  Laterality: N/A;   APPLICATION OF INTRAOPERATIVE CT SCAN N/A 05/22/2022   Procedure: APPLICATION OF INTRAOPERATIVE CT SCAN;  Surgeon: Meade Maw, MD;  Location: [facility] ORS;  Service: Neurosurgery;  Laterality: N/A;    Home Medications:  Allergies as of 05/26/2022   No Known Allergies      Medication List        Accurate as of May 26, 2022 11:54 AM. If you have any questions, ask your nurse or doctor.          bisacodyl 5 MG EC tablet Commonly known as: DULCOLAX Take 5 mg by mouth daily as needed for moderate constipation.   celecoxib 200 MG capsule Commonly known as: CELEBREX Take 1 capsule (200 mg total) by mouth  every 12 (twelve) hours.   hydrochlorothiazide 12.5 MG tablet Commonly known as: HYDRODIURIL Take 12.5 mg by mouth daily.   losartan 100 MG tablet Commonly known as: COZAAR Take 100 mg by mouth daily.   methocarbamol 500 MG tablet Commonly known as: ROBAXIN Take 1 tablet (500 mg total) by mouth every 6 (six) hours as needed for muscle spasms.   oxyCODONE 5 MG immediate release tablet Commonly known as: Oxy IR/ROXICODONE Take 1-2 tablets (5-10 mg total) by mouth every 3 (three) hours as needed for up to 7 days for moderate pain ((score 4 to 6)).   tamsulosin 0.4 MG Caps capsule Commonly known as: FLOMAX Take 1 capsule (0.4 mg total) by mouth daily.        Allergies: No Known Allergies  Family History: No family history on file.  Social History:  reports that he has never smoked. He has never used smokeless tobacco. He reports current alcohol use of about 1.0 standard drink of alcohol per week. He reports that he does not use drugs.   Physical Exam: BP (!) 162/89   Pulse 76   Ht 6' (1.829 m)   Wt 240 lb (108.9 kg)   BMI 32.55 kg/m   Constitutional:  Alert and oriented, No acute distress. HEENT: [place] AT Respiratory: Normal respiratory effort, no increased work of breathing. GU: Prostate 40 g, smooth without nodules Psychiatric: Normal mood and affect.    Assessment & Plan:    1.  Urinary retention PVR 497 mL Most likely  multifactorial due to underlying BPH, postoperative state, pain medication and constipation We discussed once the bladder is distended it loses muscle tone and in/out catheterization would not resolve the problem.  Recommended Foley catheter placement for 5-7 days with follow-up appointment next week for catheter removal/voiding trial Rx tamsulosin 0.4 mg daily sent to pharmacy UA was unremarkable   Riki Altes, MD  [facility] [address], [address] [place], [place] [address] [phone]

## 2022-05-26 NOTE — Progress Notes (Signed)
Simple Catheter Placement  Due to urinary retention patient is present today for a foley cath placement.  Patient was cleaned and prepped in a sterile fashion with betadine and 2% lidocaine jelly was instilled into the urethra. A 16 FR foley catheter was inserted, urine return was noted  31ml, urine was yellow in color.  The balloon was filled with 10cc of sterile water.  A leg bag was attached for drainage. Patient was also given a night bag to take home and was given instruction on how to change from one bag to another.  Patient was given instruction on proper catheter care.  Patient tolerated well, no complications were noted   Performed by: Tarsha,CMA   .

## 2022-05-31 NOTE — Progress Notes (Signed)
   REFERRING PHYSICIAN:  Leonel Ramsay, [place],  [place] [address]  DOS: 05/22/22 XLIF L4-L5/PSF  HISTORY OF PRESENT ILLNESS: Douglas Gibson is approximately 2 weeks status post XLIF L4-L5/PSF. Was given robaxin, celebrex, and oxycodone on discharge from the hospital.   He had some constipation and urinary issues postop. Was seen by urology last week and had foley placed. Passed voiding trial and urology removed foley yesterday. His constipation has resolved.   He has trouble sleeping and pain is waking him up. No relief with robaxin. He needs a refill of oxycodone.   His preop right leg pain is gone! He has expected LBP. He has some pain in right groin and this is improving.    PHYSICAL EXAMINATION:  General: Patient is well developed, well nourished, calm, collected, and in no apparent distress.   NEUROLOGICAL:  General: In no acute distress.   Awake, alert, oriented to person, place, and time.  Pupils equal round and reactive to light.  Facial tone is symmetric.    Strength:           Side Iliopsoas Quads Hamstring PF DF EHL  R 4 5 5 5 5 5   L 5 5 5 5 5 5    Incisions c/d/i   ROS (Neurologic):  Negative except as noted above  IMAGING: Nothing new to review.   ASSESSMENT/PLAN:  Douglas Gibson is doing well s/p above surgery. Treatment options reviewed with patient and following plan made:   - I have advised the patient to lift up to 10 pounds until 6 weeks after surgery (follow up with Dr. Izora Ribas).  - Reviewed wound care.  - No bending, twisting, or lifting.  - Continue on current medications including oxycodone. Refill given. PMP reviewed and is appropriate. - Stop robaxin. New prescription for flexeril. Reviewed dosing and side effects.  - Discussed that pain/weakness in right hip/groin was due to lateral exposure and will improve. He already has seen improvement.  - Discussed referral to PT. He wants to hold off.  - Follow up as  scheduled in 4 weeks and prn.   Advised to contact the office if any questions or concerns arise.  Geronimo Boot PA-C Department of neurosurgery

## 2022-06-01 ENCOUNTER — Ambulatory Visit: Payer: BC Managed Care – PPO | Admitting: Physician Assistant

## 2022-06-01 ENCOUNTER — Encounter: Payer: Self-pay | Admitting: Physician Assistant

## 2022-06-01 VITALS — BP 152/77 | HR 81 | Ht 73.0 in | Wt 240.0 lb

## 2022-06-01 DIAGNOSIS — R338 Other retention of urine: Secondary | ICD-10-CM

## 2022-06-01 DIAGNOSIS — N401 Enlarged prostate with lower urinary tract symptoms: Secondary | ICD-10-CM | POA: Diagnosis not present

## 2022-06-01 LAB — BLADDER SCAN AMB NON-IMAGING: Scan Result: 180 mL

## 2022-06-01 NOTE — Progress Notes (Signed)
06/01/2022 1:57 PM   Douglas Gibson [birth year] [id number]  CC: Chief Complaint  Patient presents with   Follow-up    Voiding trial   HPI: Douglas Gibson is a 61 y.o. male with PMH for lithiasis and BPH who developed urinary retention after undergoing L4/L5 spine surgery earlier this month requiring Foley catheter placement with Dr. Bernardo Heater 6 days ago who presents today for voiding trial.   Today he reports he started Flomax as prescribed by Dr. Bernardo Heater, however he stopped this because he was having some black spots in his vision on it.  The black spots have since resolved and he is feeling well.  Foley catheter removed in the morning, see separate procedure note for details.  He has been pushing fluids and urinated several times throughout the day.  He denies dysuria.  PVR 180 mL.  PMH: Past Medical History:  Diagnosis Date   Family history of adverse reaction to anesthesia    father had stroke coming out of anesthesia at the age of 68   History of kidney stones    Hypertension     Surgical History: Past Surgical History:  Procedure Laterality Date   ANTERIOR LATERAL LUMBAR FUSION WITH PERCUTANEOUS SCREW 1 LEVEL N/A 05/22/2022   Procedure: L4-5 LATERAL LUMBAR FUSION AND POSTERIOR SPINAL FUSION;  Surgeon: Meade Maw, MD;  Location: [facility] ORS;  Service: Neurosurgery;  Laterality: N/A;   APPLICATION OF INTRAOPERATIVE CT SCAN N/A 05/22/2022   Procedure: APPLICATION OF INTRAOPERATIVE CT SCAN;  Surgeon: Meade Maw, MD;  Location: [facility] ORS;  Service: Neurosurgery;  Laterality: N/A;    Home Medications:  Allergies as of 06/01/2022   No Known Allergies      Medication List        Accurate as of June 01, 2022  1:57 PM. If you have any questions, ask your nurse or doctor.          STOP taking these medications    tamsulosin 0.4 MG Caps capsule Commonly known as: FLOMAX Stopped by: Debroah Loop, PA-C       TAKE these medications     bisacodyl 5 MG EC tablet Commonly known as: DULCOLAX Take 5 mg by mouth daily as needed for moderate constipation.   celecoxib 200 MG capsule Commonly known as: CELEBREX Take 1 capsule (200 mg total) by mouth every 12 (twelve) hours.   hydrochlorothiazide 12.5 MG tablet Commonly known as: HYDRODIURIL Take 12.5 mg by mouth daily.   losartan 100 MG tablet Commonly known as: COZAAR Take 100 mg by mouth daily.   methocarbamol 500 MG tablet Commonly known as: ROBAXIN Take 1 tablet (500 mg total) by mouth every 6 (six) hours as needed for muscle spasms.        Allergies:  No Known Allergies  Family History: No family history on file.  Social History:   reports that he has never smoked. He has never been exposed to tobacco smoke. He has never used smokeless tobacco. He reports current alcohol use of about 1.0 standard drink of alcohol per week. He reports that he does not use drugs.  Physical Exam: BP (!) 152/77   Pulse 81   Ht 6\' 1"  (1.854 m)   Wt 240 lb (108.9 kg)   BMI 31.66 kg/m   Constitutional:  Alert and oriented, no acute distress, nontoxic appearing HEENT: [place], AT Cardiovascular: No clubbing, cyanosis, or edema Respiratory: Normal respiratory effort, no increased work of breathing Skin: No rashes, bruises or suspicious lesions Neurologic: Grossly  intact, no focal deficits, moving all 4 extremities Psychiatric: Normal mood and affect  Laboratory Data: Results for orders placed or performed in visit on 06/01/22  BLADDER SCAN AMB NON-IMAGING  Result Value Ref Range   Scan Result 180 mL   Assessment & Plan:   1. Benign prostatic hyperplasia with urinary retention Voiding trial passed today.  He did not tolerate Flomax.  We will plan for repeat PVR and IPSS off alpha blockers in 1 month.  May consider a more prostate-specific alpha-blocker such as Rapaflo in the future if needed.  He is in agreement with this plan.  We discussed return precautions including  dysuria, testicular pain, and abdominal pain.  Return in about 4 weeks (around 06/29/2022) for PVR, IPSS.  Debroah Loop, PA-C  [facility] [address] [place], [place] [address] [phone]

## 2022-06-01 NOTE — Progress Notes (Signed)
Patient ID: Douglas Gibson, male   DOB: 08-Feb-1961, 61 y.o.   MRN: [id number] Catheter Removal  Patient is present today for a catheter removal.  94ml of water was drained from the balloon. A 16FR foley cath was removed from the bladder, no complications were noted. Patient tolerated well.  Performed by: Edwin Dada, [place]  Follow up/ Additional notes: 06/01/2022 @ 1:30pm

## 2022-06-02 ENCOUNTER — Encounter: Payer: Self-pay | Admitting: Orthopedic Surgery

## 2022-06-02 ENCOUNTER — Ambulatory Visit (INDEPENDENT_AMBULATORY_CARE_PROVIDER_SITE_OTHER): Payer: BC Managed Care – PPO | Admitting: Orthopedic Surgery

## 2022-06-02 VITALS — BP 145/87 | HR 92 | Temp 97.8°F

## 2022-06-02 DIAGNOSIS — Z981 Arthrodesis status: Secondary | ICD-10-CM

## 2022-06-02 DIAGNOSIS — M5441 Lumbago with sciatica, right side: Secondary | ICD-10-CM

## 2022-06-02 DIAGNOSIS — M5442 Lumbago with sciatica, left side: Secondary | ICD-10-CM

## 2022-06-02 DIAGNOSIS — M4316 Spondylolisthesis, lumbar region: Secondary | ICD-10-CM

## 2022-06-02 DIAGNOSIS — G8929 Other chronic pain: Secondary | ICD-10-CM

## 2022-06-02 DIAGNOSIS — Z09 Encounter for follow-up examination after completed treatment for conditions other than malignant neoplasm: Secondary | ICD-10-CM

## 2022-06-02 MED ORDER — OXYCODONE HCL 5 MG PO TABS: 5.0000 mg | ORAL_TABLET | ORAL | 0 refills | Status: DC | PRN

## 2022-06-02 MED ORDER — CYCLOBENZAPRINE HCL 10 MG PO TABS: 10.0000 mg | ORAL_TABLET | Freq: Three times a day (TID) | ORAL | 0 refills | Status: DC | PRN

## 2022-06-12 ENCOUNTER — Other Ambulatory Visit: Payer: Self-pay | Admitting: Orthopedic Surgery

## 2022-06-12 DIAGNOSIS — M4316 Spondylolisthesis, lumbar region: Secondary | ICD-10-CM

## 2022-06-12 DIAGNOSIS — Z981 Arthrodesis status: Secondary | ICD-10-CM

## 2022-06-12 DIAGNOSIS — G8929 Other chronic pain: Secondary | ICD-10-CM

## 2022-06-12 MED ORDER — OXYCODONE HCL 5 MG PO TABS: 5.0000 mg | ORAL_TABLET | ORAL | 0 refills | Status: DC | PRN

## 2022-06-12 NOTE — Progress Notes (Signed)
See MyChart message.   05/22/22 XLIF L4-L5/PSF   PMP reviewed and appropriate. Refill of oxycodone sent to his pharmacy.

## 2022-06-12 NOTE — Telephone Encounter (Addendum)
05/22/22 XLIF L4-L5/PSF   PMP reviewed and appropriate. Refill of oxycodone sent to his pharmacy.   Had to do refill under separate encounter.

## 2022-06-13 NOTE — Telephone Encounter (Signed)
I submitted an authorization for the oxycodone yesterday and this has now been approved.   PA Case: [id number], Status: Approved, Coverage Starts on: 06/13/2022 12:00:00 AM, Coverage Ends on: 07/13/2022 12:00:00 AM.

## 2022-06-22 ENCOUNTER — Encounter: Payer: BC Managed Care – PPO | Admitting: Neurosurgery

## 2022-06-27 ENCOUNTER — Ambulatory Visit: Payer: BC Managed Care – PPO | Admitting: Physician Assistant

## 2022-06-27 ENCOUNTER — Encounter: Payer: Self-pay | Admitting: Physician Assistant

## 2022-06-29 ENCOUNTER — Ambulatory Visit (INDEPENDENT_AMBULATORY_CARE_PROVIDER_SITE_OTHER): Payer: BC Managed Care – PPO | Admitting: Physician Assistant

## 2022-06-29 VITALS — BP 124/76 | HR 103 | Ht 72.0 in | Wt 240.0 lb

## 2022-06-29 DIAGNOSIS — N401 Enlarged prostate with lower urinary tract symptoms: Secondary | ICD-10-CM | POA: Diagnosis not present

## 2022-06-29 DIAGNOSIS — Z8042 Family history of malignant neoplasm of prostate: Secondary | ICD-10-CM | POA: Diagnosis not present

## 2022-06-29 DIAGNOSIS — N5312 Painful ejaculation: Secondary | ICD-10-CM | POA: Diagnosis not present

## 2022-06-29 DIAGNOSIS — R338 Other retention of urine: Secondary | ICD-10-CM

## 2022-06-29 LAB — URINALYSIS, COMPLETE
Bilirubin, UA: NEGATIVE
Glucose, UA: NEGATIVE
Ketones, UA: NEGATIVE
Leukocytes,UA: NEGATIVE
Nitrite, UA: NEGATIVE
Protein,UA: NEGATIVE
RBC, UA: NEGATIVE
Specific Gravity, UA: <1.005 — ABNORMAL LOW (ref 1.005–1.030)
Urobilinogen, Ur: 0.2 mg/dL (ref 0.2–1.0)
pH, UA: 6 (ref 5.0–7.5)

## 2022-06-29 LAB — MICROSCOPIC EXAMINATION

## 2022-06-29 NOTE — Progress Notes (Signed)
06/29/2022 5:18 PM   Douglas Gibson February 20, 1961 [id number]  CC: Chief Complaint  Patient presents with   Follow-up   HPI: Douglas Gibson is a 61 y.o. male with PMH nephrolithiasis and BPH who developed urinary retention after undergoing L4-L5 spine surgery in October who presents today for 1 month repeat PVR with symptom recheck.   Today he reports he had significant genital irritation from his Foley catheter.  Ever since removal, he has noticed some stinging in the glans penis with ejaculation as well as decreased velocity of ejaculations.  This is intermittent and rather mild.  He also reports today that both his father and brother were diagnosed with prostate cancer around age 26.  He is unsure what therapy his father underwent, but he died from other causes.  His brother is still living and is s/p prostatectomy, no known metastasis.  He reports 3 concerns today.  First, should he be taking Flomax routinely, second, what if anything should be done about his ejaculation issues as above, and third, how should we proceed with prostate cancer screening.  In-office UA and microscopy today pan negative. PVR 71mL.  IPSS 6/mostly satisfied as below.   IPSS     Row Name 06/29/22 1100         International Prostate Symptom Score   How often have you had the sensation of not emptying your bladder? Not at All     How often have you had to urinate less than every two hours? Less than half the time     How often have you found you stopped and started again several times when you urinated? Not at All     How often have you found it difficult to postpone urination? Not at All     How often have you had a weak urinary stream? Less than half the time     How often have you had to strain to start urination? Not at All     How many times did you typically get up at night to urinate? 2 Times     Total IPSS Score 6       Quality of Life due to urinary symptoms   If you were to spend the rest of  your life with your urinary condition just the way it is now how would you feel about that? Mostly Satisfied               PMH: Past Medical History:  Diagnosis Date   Family history of adverse reaction to anesthesia    father had stroke coming out of anesthesia at the age of 25   History of kidney stones    Hypertension     Surgical History: Past Surgical History:  Procedure Laterality Date   ANTERIOR LATERAL LUMBAR FUSION WITH PERCUTANEOUS SCREW 1 LEVEL N/A 05/22/2022   Procedure: L4-5 LATERAL LUMBAR FUSION AND POSTERIOR SPINAL FUSION;  Surgeon: Venetia Night, MD;  Location: [facility] ORS;  Service: Neurosurgery;  Laterality: N/A;   APPLICATION OF INTRAOPERATIVE CT SCAN N/A 05/22/2022   Procedure: APPLICATION OF INTRAOPERATIVE CT SCAN;  Surgeon: Venetia Night, MD;  Location: [facility] ORS;  Service: Neurosurgery;  Laterality: N/A;    Home Medications:  Allergies as of 06/29/2022   No Known Allergies      Medication List        Accurate as of June 29, 2022  5:18 PM. If you have any questions, ask your nurse or doctor.  STOP taking these medications    bisacodyl 5 MG EC tablet Commonly known as: DULCOLAX   celecoxib 200 MG capsule Commonly known as: CELEBREX   cyclobenzaprine 10 MG tablet Commonly known as: FLEXERIL   oxyCODONE 5 MG immediate release tablet Commonly known as: Roxicodone       TAKE these medications    hydrochlorothiazide 12.5 MG tablet Commonly known as: HYDRODIURIL Take 12.5 mg by mouth daily.   losartan 100 MG tablet Commonly known as: COZAAR Take 100 mg by mouth daily.        Allergies:  No Known Allergies  Family History: Family History  Problem Relation Age of Onset   Prostate cancer Father 54       Underwent unknown treatment, deceased from other cause   Prostate cancer Brother 88       S/p prostatectomy, no mets, still living    Social History:   reports that he has never smoked. He has never been  exposed to tobacco smoke. He has never used smokeless tobacco. He reports current alcohol use of about 1.0 standard drink of alcohol per week. He reports that he does not use drugs.  Physical Exam: BP 124/76   Pulse (!) 103   Ht 6' (1.829 m)   Wt 240 lb (108.9 kg)   BMI 32.55 kg/m   Constitutional:  Alert and oriented, no acute distress, nontoxic appearing HEENT: [place], AT Cardiovascular: No clubbing, cyanosis, or edema Respiratory: Normal respiratory effort, no increased work of breathing Skin: No rashes, bruises or suspicious lesions Neurologic: Grossly intact, no focal deficits, moving all 4 extremities Psychiatric: Normal mood and affect  Laboratory Data: Results for orders placed or performed in visit on 06/29/22  Microscopic Examination   Urine  Result Value Ref Range   WBC, UA 0-5 0 - 5 /hpf   RBC, Urine 0-2 0 - 2 /hpf   Epithelial Cells (non renal) 0-10 0 - 10 /hpf   Bacteria, UA Few None seen/Few  Urinalysis, Complete  Result Value Ref Range   Specific Gravity, UA <1.005 (L) 1.005 - 1.030   pH, UA 6.0 5.0 - 7.5   Color, UA Yellow Yellow   Appearance Ur Clear Clear   Leukocytes,UA Negative Negative   Protein,UA Negative Negative/Trace   Glucose, UA Negative Negative   Ketones, UA Negative Negative   RBC, UA Negative Negative   Bilirubin, UA Negative Negative   Urobilinogen, Ur 0.2 0.2 - 1.0 mg/dL   Nitrite, UA Negative Negative   Microscopic Examination See below:   Bladder Scan (Post Void Residual) in office  Result Value Ref Range   Scan Result 7 mL   Assessment & Plan:   1. Benign prostatic hyperplasia with urinary retention Resolved, follow-up PVR remains WNL.  IPSS with only mild symptoms.  I do not recommend starting Flomax at this time, as it is likely to further decrease the caliber of his ejaculations, which is already bothering him.  Additionally, we discussed that he is minimally symptomatic and we can reserve pharmacotherapy for the future if his  symptoms worsen.  He is in agreement with this plan. - Bladder Scan (Post Void Residual) in office  2. Painful ejaculation With benign UA today, we discussed that his stinging with ejaculation and decreased caliber of ejaculation since Foley catheter removal likely represents some prostatic inflammation that would be time-limited and resolve on its own.  I do not see any evidence of bacterial infection today, no indication for antibiotics.  I asked him  to return to clinic if his symptoms do not resolve within the next 6 weeks. - Urinalysis, Complete  3. Family history of prostate cancer in father His PSAs have been low, and alone would put him in the low risk category for aggressive prostate cancers,, however given his strong family history with prostate cancer in his father and brother I recommend pursuing PSA screening and DRE's every 2 years.  He is in agreement with this plan.  We will see him back next year as he will be due for PSA and DRE at that time.  Return in about 1 year (around 06/30/2023) for ipps,pvr,dre, 1 year follow up .  Carman Ching, PA-C  [facility] [address] [place], [place] [address] [phone]

## 2022-06-30 ENCOUNTER — Encounter: Payer: Self-pay | Admitting: Physician Assistant

## 2022-06-30 LAB — BLADDER SCAN AMB NON-IMAGING: Scan Result: 7 mL

## 2022-07-03 ENCOUNTER — Ambulatory Visit: Payer: BC Managed Care – PPO | Admitting: Physician Assistant

## 2022-07-04 ENCOUNTER — Encounter: Payer: BC Managed Care – PPO | Admitting: Neurosurgery

## 2022-07-10 ENCOUNTER — Other Ambulatory Visit: Payer: Self-pay

## 2022-07-10 DIAGNOSIS — Z981 Arthrodesis status: Secondary | ICD-10-CM

## 2022-07-11 ENCOUNTER — Encounter: Payer: Self-pay | Admitting: Neurosurgery

## 2022-07-11 ENCOUNTER — Ambulatory Visit (INDEPENDENT_AMBULATORY_CARE_PROVIDER_SITE_OTHER): Payer: BC Managed Care – PPO | Admitting: Neurosurgery

## 2022-07-11 ENCOUNTER — Ambulatory Visit
Admission: RE | Admit: 2022-07-11 | Discharge: 2022-07-11 | Disposition: A | Discharge status: Home / Self Care | Payer: BC Managed Care – PPO | Source: Ambulatory Visit | Attending: Neurosurgery | Admitting: Neurosurgery

## 2022-07-11 ENCOUNTER — Ambulatory Visit
Admission: RE | Admit: 2022-07-11 | Discharge: 2022-07-11 | Disposition: A | Discharge status: Home / Self Care | Payer: BC Managed Care – PPO | Attending: Neurosurgery | Admitting: Neurosurgery

## 2022-07-11 VITALS — BP 163/95 | HR 87 | Temp 97.6°F | Wt 241.6 lb

## 2022-07-11 DIAGNOSIS — Z981 Arthrodesis status: Secondary | ICD-10-CM | POA: Insufficient documentation

## 2022-07-11 DIAGNOSIS — M4316 Spondylolisthesis, lumbar region: Secondary | ICD-10-CM

## 2022-07-11 DIAGNOSIS — Z09 Encounter for follow-up examination after completed treatment for conditions other than malignant neoplasm: Secondary | ICD-10-CM

## 2022-07-11 DIAGNOSIS — M5442 Lumbago with sciatica, left side: Secondary | ICD-10-CM

## 2022-07-11 DIAGNOSIS — M5441 Lumbago with sciatica, right side: Secondary | ICD-10-CM

## 2022-07-11 NOTE — Progress Notes (Signed)
   REFERRING PHYSICIAN:  Mick Sell, Md [address] [place],  [place] [address]  DOS: 05/22/22 XLIF L4-L5/PSF  HISTORY OF PRESENT ILLNESS: Douglas Gibson is status post XLIF L4-L5/PSF.   He is doing very well.  He has a bit of pain.  He has had 1 episode where he lost his balance.    PHYSICAL EXAMINATION:  General: Patient is well developed, well nourished, calm, collected, and in no apparent distress.   NEUROLOGICAL:  General: In no acute distress.   Awake, alert, oriented to person, place, and time.  Pupils equal round and reactive to light.  Facial tone is symmetric.    Strength:           Side Iliopsoas Quads Hamstring PF DF EHL  R 4 5 5 5 5 5   L 5 5 5 5 5 5    Incisions c/d/i   ROS (Neurologic):  Negative except as noted above  IMAGING: No complications noted  ASSESSMENT/PLAN:  [employer] is doing well s/p above surgery.   He is not ready to return to work.  We reviewed his activity limitations.  He is now on a 25 pound lifting limit.  He will still be careful with bending and twisting.  I have cleared him for light exercise, but no high-impact exercise.  , MD Department of neurosurgery

## 2022-07-27 ENCOUNTER — Encounter: Payer: BC Managed Care – PPO | Admitting: Neurosurgery

## 2022-08-09 ENCOUNTER — Other Ambulatory Visit: Payer: Self-pay

## 2022-08-09 DIAGNOSIS — Z981 Arthrodesis status: Secondary | ICD-10-CM

## 2022-08-15 ENCOUNTER — Ambulatory Visit (INDEPENDENT_AMBULATORY_CARE_PROVIDER_SITE_OTHER): Payer: BC Managed Care – PPO | Admitting: Neurosurgery

## 2022-08-15 ENCOUNTER — Ambulatory Visit
Admission: RE | Admit: 2022-08-15 | Discharge: 2022-08-15 | Disposition: A | Discharge status: Home / Self Care | Payer: BC Managed Care – PPO | Source: Ambulatory Visit | Attending: Neurosurgery | Admitting: Neurosurgery

## 2022-08-15 ENCOUNTER — Ambulatory Visit
Admission: RE | Admit: 2022-08-15 | Discharge: 2022-08-15 | Disposition: A | Discharge status: Home / Self Care | Payer: BC Managed Care – PPO | Attending: Neurosurgery | Admitting: Neurosurgery

## 2022-08-15 ENCOUNTER — Encounter: Payer: Self-pay | Admitting: Neurosurgery

## 2022-08-15 VITALS — BP 134/98 | HR 97 | Temp 97.8°F | Wt 245.4 lb

## 2022-08-15 DIAGNOSIS — M4316 Spondylolisthesis, lumbar region: Secondary | ICD-10-CM

## 2022-08-15 DIAGNOSIS — Z981 Arthrodesis status: Secondary | ICD-10-CM | POA: Insufficient documentation

## 2022-08-15 DIAGNOSIS — M5442 Lumbago with sciatica, left side: Secondary | ICD-10-CM

## 2022-08-15 DIAGNOSIS — M5441 Lumbago with sciatica, right side: Secondary | ICD-10-CM

## 2022-08-15 DIAGNOSIS — Z09 Encounter for follow-up examination after completed treatment for conditions other than malignant neoplasm: Secondary | ICD-10-CM

## 2022-08-15 NOTE — Progress Notes (Signed)
   REFERRING PHYSICIAN:  Leonel Ramsay, Md [place],  [place] [id number]  DOS: 05/22/22 XLIF L4-L5/PSF  HISTORY OF PRESENT ILLNESS: SELAH ZELMAN is status post XLIF L4-L5/PSF.  He is doing well.  He has occasional aches and pains in particular movements, but is overall much improved compared to before surgery.    PHYSICAL EXAMINATION:  General: Patient is well developed, well nourished, calm, collected, and in no apparent distress.   NEUROLOGICAL:  General: In no acute distress.   Awake, alert, oriented to person, place, and time.  Pupils equal round and reactive to light.  Facial tone is symmetric.    Strength:           Side Iliopsoas Quads Hamstring PF DF EHL  R 4+ 5 5 5 5 5   L 5 5 5 5 5 5    Incisions c/d/i   ROS (Neurologic):  Negative except as noted above  IMAGING: No complications noted  ASSESSMENT/PLAN:  ALMIN LIVINGSTONE is doing well s/p above surgery.  We reviewed his activity limitations.  He is ready to return to work.  I will send him back to work next week.  I will see him back in 6 months with x-rays.  Meade Maw, MD Department o+f neurosurgery

## 2023-02-09 ENCOUNTER — Other Ambulatory Visit: Payer: Self-pay

## 2023-02-09 DIAGNOSIS — G8929 Other chronic pain: Secondary | ICD-10-CM

## 2023-02-13 ENCOUNTER — Ambulatory Visit: Payer: Self-pay | Admitting: Neurosurgery

## 2023-03-28 ENCOUNTER — Other Ambulatory Visit: Payer: Self-pay

## 2023-03-28 DIAGNOSIS — G8929 Other chronic pain: Secondary | ICD-10-CM

## 2023-04-02 ENCOUNTER — Ambulatory Visit
Admission: RE | Admit: 2023-04-02 | Discharge: 2023-04-02 | Disposition: A | Discharge status: Home / Self Care | Payer: BC Managed Care – PPO | Attending: Neurosurgery | Admitting: Neurosurgery

## 2023-04-02 ENCOUNTER — Ambulatory Visit
Admission: RE | Admit: 2023-04-02 | Discharge: 2023-04-02 | Disposition: A | Discharge status: Home / Self Care | Payer: BC Managed Care – PPO | Source: Ambulatory Visit | Attending: Neurosurgery | Admitting: Neurosurgery

## 2023-04-02 DIAGNOSIS — G8929 Other chronic pain: Secondary | ICD-10-CM

## 2023-04-02 DIAGNOSIS — M5442 Lumbago with sciatica, left side: Secondary | ICD-10-CM | POA: Insufficient documentation

## 2023-04-02 DIAGNOSIS — M5441 Lumbago with sciatica, right side: Secondary | ICD-10-CM | POA: Diagnosis present

## 2023-04-03 ENCOUNTER — Encounter: Payer: Self-pay | Admitting: Neurosurgery

## 2023-04-03 ENCOUNTER — Ambulatory Visit: Payer: BC Managed Care – PPO | Admitting: Neurosurgery

## 2023-04-03 VITALS — BP 124/72 | Ht 72.0 in | Wt 245.0 lb

## 2023-04-03 DIAGNOSIS — G8929 Other chronic pain: Secondary | ICD-10-CM | POA: Diagnosis not present

## 2023-04-03 DIAGNOSIS — M5442 Lumbago with sciatica, left side: Secondary | ICD-10-CM

## 2023-04-03 DIAGNOSIS — M5441 Lumbago with sciatica, right side: Secondary | ICD-10-CM

## 2023-04-03 DIAGNOSIS — M4316 Spondylolisthesis, lumbar region: Secondary | ICD-10-CM | POA: Diagnosis not present

## 2023-04-03 DIAGNOSIS — Z09 Encounter for follow-up examination after completed treatment for conditions other than malignant neoplasm: Secondary | ICD-10-CM

## 2023-04-03 NOTE — Progress Notes (Signed)
   REFERRING PHYSICIAN:  No referring provider defined for this encounter.  DOS: 05/22/22 XLIF L4-L5/PSF  HISTORY OF PRESENT ILLNESS: Douglas Gibson is status post XLIF L4-L5/PSF.  He is doing well.  He has occasional aches and pains in particular movements, but is overall much improved compared to before surgery.    PHYSICAL EXAMINATION:  General: Patient is well developed, well nourished, calm, collected, and in no apparent distress.   NEUROLOGICAL:  General: In no acute distress.   Awake, alert, oriented to person, place, and time.  Pupils equal round and reactive to light.  Facial tone is symmetric.    Strength:           Side Iliopsoas Quads Hamstring PF DF EHL  R 5 5 5 5 5 5   L 5 5 5 5 5 5    Incisions c/d/i   ROS (Neurologic):  Negative except as noted above  IMAGING: No complications noted  ASSESSMENT/PLAN:  Douglas Gibson is doing well s/p above surgery.  I am very pleased with his improvements.  I will see him back on an as-needed basis.     Venetia Night, MD Department of Neurosurgery

## 2023-05-14 ENCOUNTER — Telehealth: Payer: Self-pay | Admitting: Neurosurgery

## 2023-05-14 NOTE — Telephone Encounter (Signed)
Case manager with [facility]  [phone]

## 2023-06-28 ENCOUNTER — Ambulatory Visit: Payer: BC Managed Care – PPO | Admitting: Physician Assistant

## 2023-07-03 ENCOUNTER — Ambulatory Visit: Payer: BC Managed Care – PPO | Admitting: Physician Assistant

## 2023-07-17 IMAGING — MR MR LUMBAR SPINE W/O CM
4 of 5 series · 25 of 48 positions shown · non-contrast
Comparison: Report from 07/06/2021

CLINICAL DATA: Chronic low back pain over the last 10 years. Right
leg and foot numbness.

EXAM:
MRI LUMBAR SPINE WITHOUT CONTRAST
TECHNIQUE: Multiplanar, multisequence MR imaging of the lumbar spine was
performed. No intravenous contrast was administered.

[Series 2: T2 · sagittal · 4.0mm · 0.53mm/px · 6 of 15 slices shown (1 of 2)]
[im 1/15]
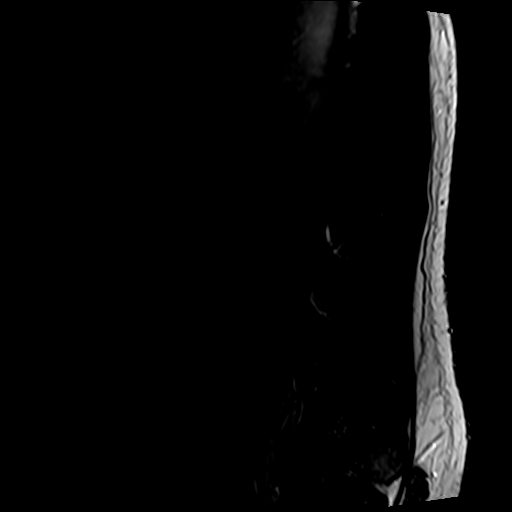
[im 3/15]
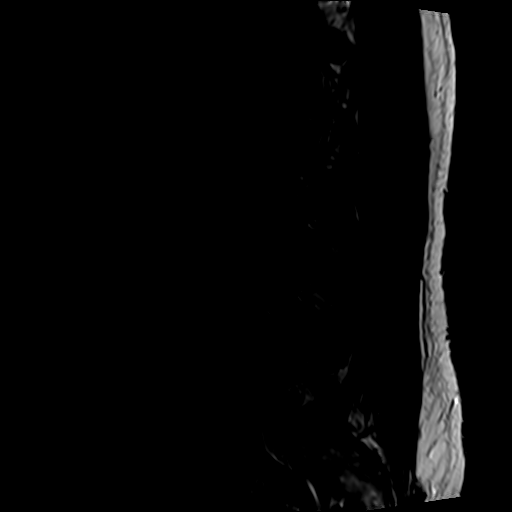
[im 6/15]
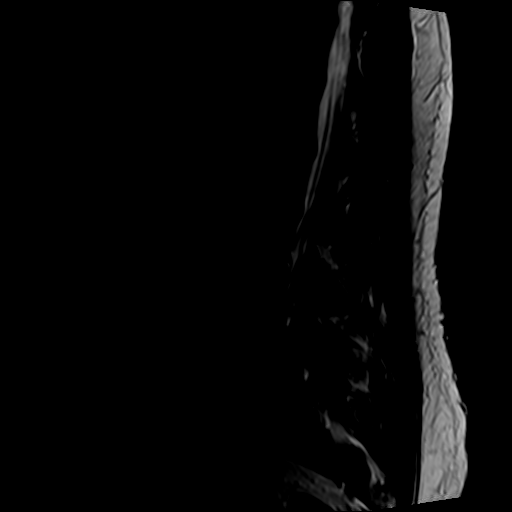
[im 9/15]
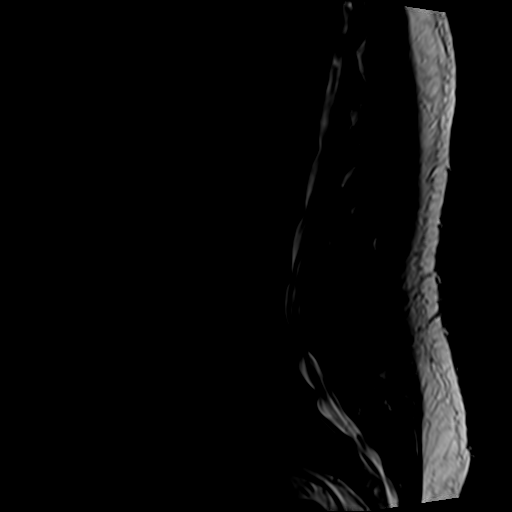
[im 12/15]
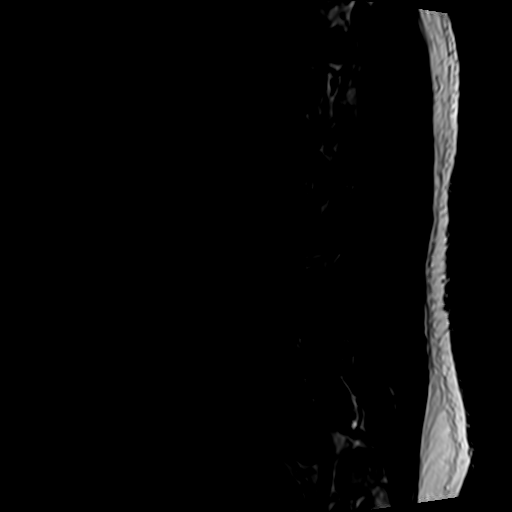
[im 15/15]
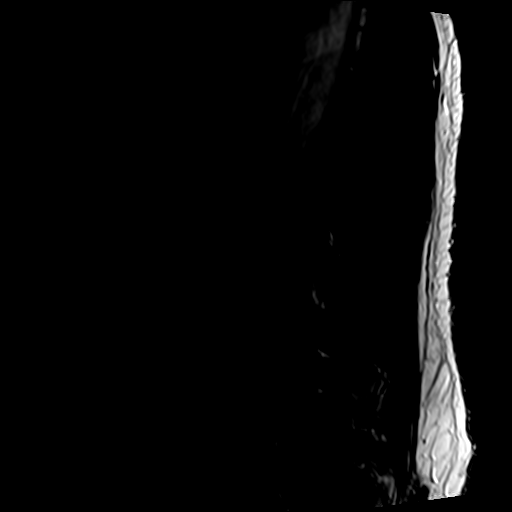

[Series 4: T1 · sagittal · 4.0mm · 0.53mm/px · 6 of 15 slices shown (1 of 2)]
[im 1/15]
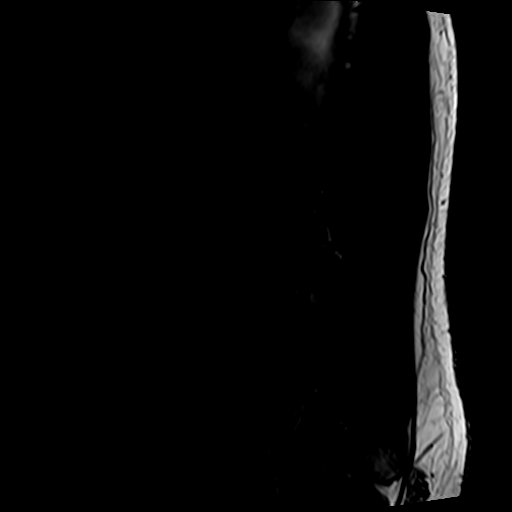
[im 3/15]
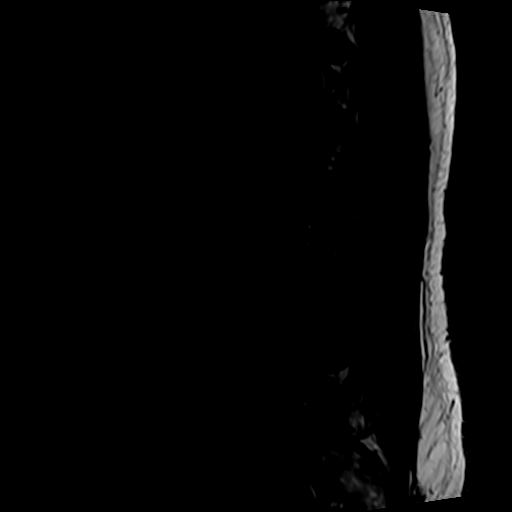
[im 6/15]
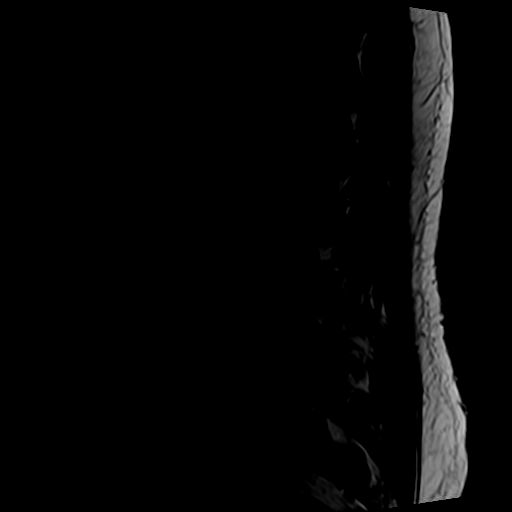
[im 9/15]
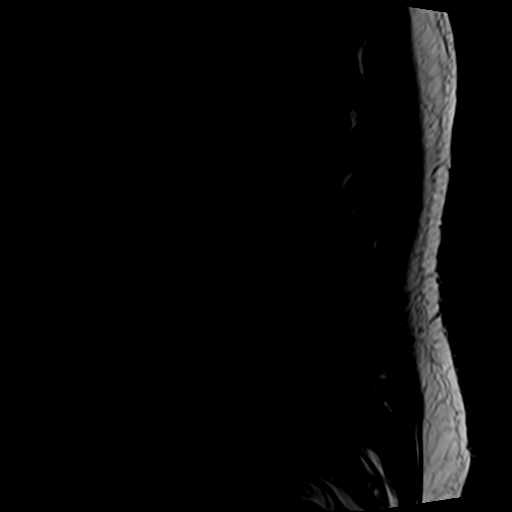
[im 12/15]
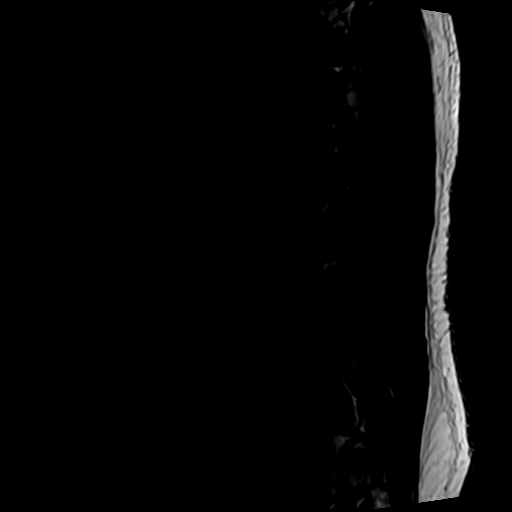
[im 15/15]
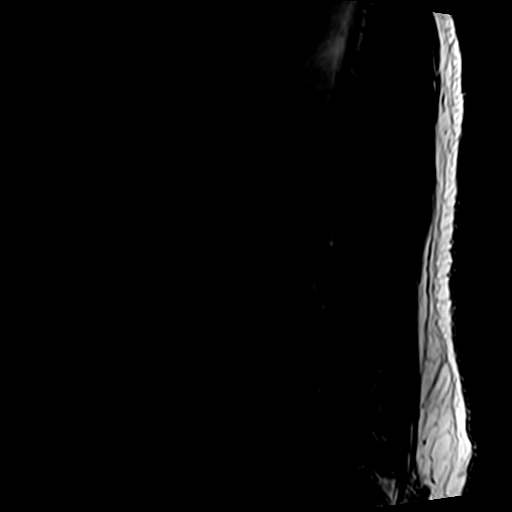

[Series 5: T2 · axial · 4.0mm · 0.70mm/px · z∈[-62,+160]mm · 9 of 39 slices shown (2 of 2)]
[im 1/39]
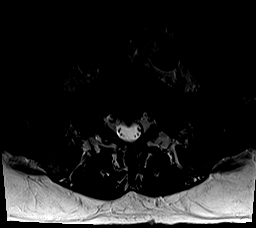
[im 6/39]
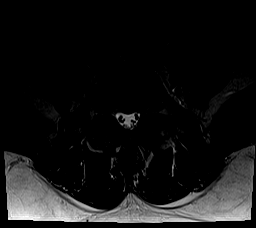
[im 11/39]
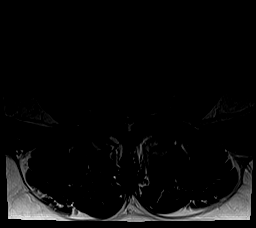
[im 17/39]
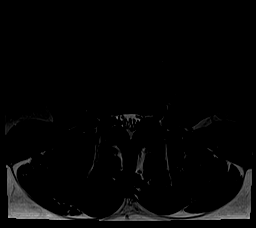
[im 20/39]
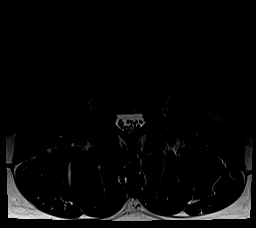
[im 22/39]
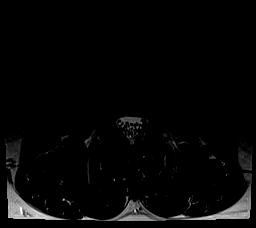
[im 28/39]
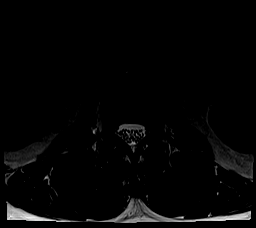
[im 33/39]
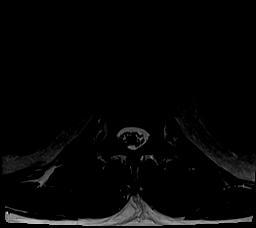
[im 39/39]
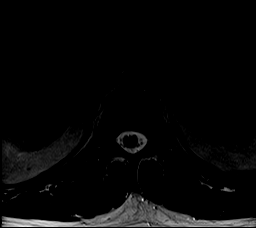

[Series 6: T1 · axial · 4.0mm · 0.35mm/px · z∈[-62,+129]mm · 4 of 39 slices shown (2 of 2)]
[im 1/39]
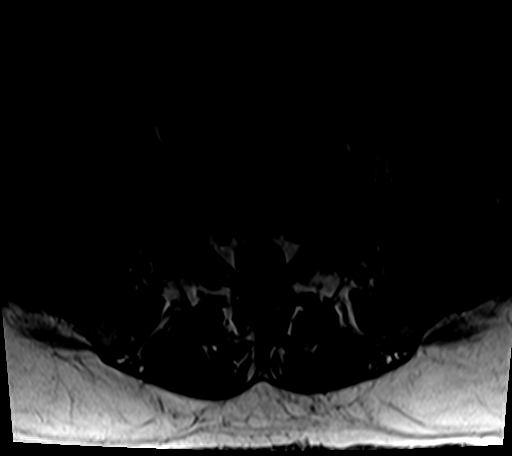
[im 6/39]
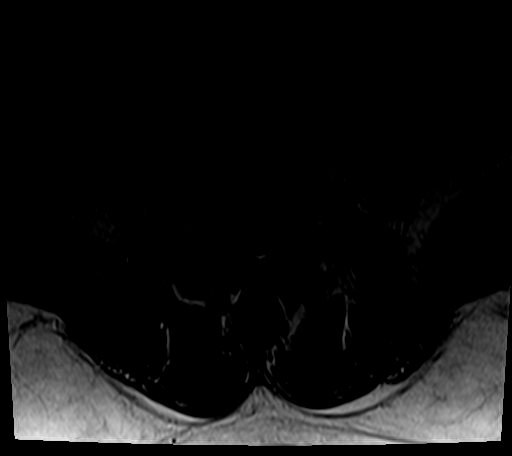
[im 20/39]
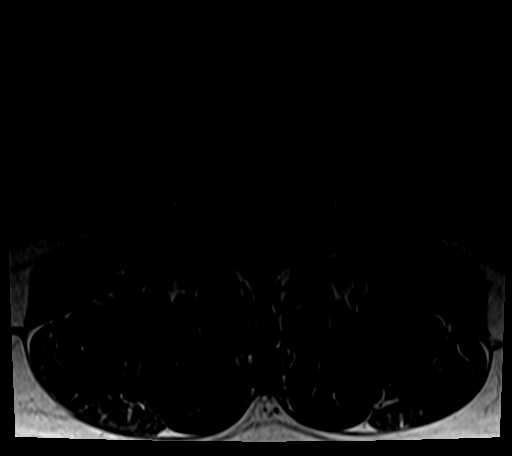
[im 33/39]
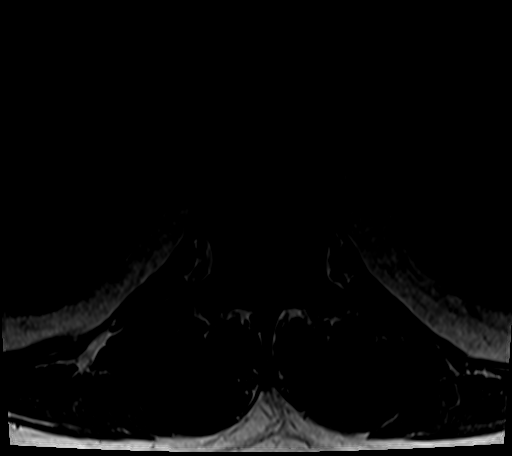

[25 of 48 positions shown; findings below may reference images not displayed]

FINDINGS: Segmentation: The lowest lumbar type non-rib-bearing vertebra is
labeled as L5.

Alignment:  5 mm degenerative anterolisthesis at L4-5.

Vertebrae: Degenerative facet edema particularly bilaterally at the
L4-5 level and to a lesser extent at L3-4. Mild disc desiccation and
mild loss of disc height at all levels between L2 and L5.

Conus medullaris and cauda equina: Conus extends to the L1-2 level.
Conus and cauda equina appear normal.

Paraspinal and other soft tissues: Unremarkable

Disc levels:

T12-L1: Unremarkable.

L1-2: No impingement.  Mild disc bulge.

L2-3: Mild displacement of the right L2 nerve in the lateral
extraforaminal space due to right lateral extraforaminal disc
protrusion.

L3-4: Mild bilateral foraminal stenosis due to disc bulge and facet
arthropathy.

L4-5: Prominent central narrowing of the thecal sac with moderate
bilateral foraminal stenosis and moderate bilateral subarticular
lateral recess stenosis due to disc uncovering, disc bulge, and
degenerative facet arthropathy. Left foraminal annular tear noted.
AP diameter of the thecal sac is 0.6 cm^2 and the cross-sectional
area of the thecal sac is 0.3 cm^2

L5-S1: Mild bilateral foraminal stenosis and borderline right
subarticular lateral recess stenosis due to disc bulge,
intervertebral spurring, and facet arthropathy.
IMPRESSION: 1. Lumbar spondylosis and degenerative disc disease, causing
prominent impingement at L4-5 and mild impingement at L2-3, L3-4,
and L5-S1, as detailed above. Anterolisthesis at L4-5 is also
contributory to the impingement at that level.
2. Degenerative facet edema bilaterally at L4-5 and to a lesser
extent at L3-4.

## 2023-07-24 ENCOUNTER — Ambulatory Visit (INDEPENDENT_AMBULATORY_CARE_PROVIDER_SITE_OTHER): Payer: BC Managed Care – PPO | Admitting: Physician Assistant

## 2023-07-24 ENCOUNTER — Ambulatory Visit: Payer: BC Managed Care – PPO | Admitting: Physician Assistant

## 2023-07-24 ENCOUNTER — Encounter: Payer: Self-pay | Admitting: Physician Assistant

## 2023-07-24 VITALS — BP 132/83 | HR 97 | Ht 72.0 in | Wt 235.0 lb

## 2023-07-24 DIAGNOSIS — N400 Enlarged prostate without lower urinary tract symptoms: Secondary | ICD-10-CM

## 2023-07-24 LAB — BLADDER SCAN AMB NON-IMAGING: Scan Result: 0

## 2023-07-24 NOTE — Progress Notes (Signed)
07/24/2023 2:50 PM   PANAYOTIS DECICCO 09-10-1960 [id number]  CC: Chief Complaint  Patient presents with   Benign Prostatic Hypertrophy   HPI: Douglas Gibson is a 62 y.o. male with PMH nephrolithiasis and BPH who developed urinary retention after undergoing L4-L5 spine surgery in October 2023 who presents today for annual follow-up.   Today he reports no new urinary concerns or health issues over the past year.  He denies dysuria, gross hematuria, flank pain, or difficulty voiding.  IPSS 7/pleased as below.  PVR 0 mL.  PSA history as below. 02/03/2021: 0.68 03/07/2023: 1.37   IPSS     Row Name 07/24/23 1400         International Prostate Symptom Score   How often have you had the sensation of not emptying your bladder? Not at All     How often have you had to urinate less than every two hours? Less than 1 in 5 times     How often have you found you stopped and started again several times when you urinated? Not at All     How often have you found it difficult to postpone urination? Not at All     How often have you had a weak urinary stream? Less than half the time     How often have you had to strain to start urination? Less than half the time     How many times did you typically get up at night to urinate? 2 Times     Total IPSS Score 7       Quality of Life due to urinary symptoms   If you were to spend the rest of your life with your urinary condition just the way it is now how would you feel about that? Pleased               PMH: Past Medical History:  Diagnosis Date   Family history of adverse reaction to anesthesia    father had stroke coming out of anesthesia at the age of 61   History of kidney stones    Hypertension     Surgical History: Past Surgical History:  Procedure Laterality Date   ANTERIOR LATERAL LUMBAR FUSION WITH PERCUTANEOUS SCREW 1 LEVEL N/A 05/22/2022   Procedure: L4-5 LATERAL LUMBAR FUSION AND POSTERIOR SPINAL FUSION;  Surgeon: Venetia Night, MD;  Location: [facility] ORS;  Service: Neurosurgery;  Laterality: N/A;   APPLICATION OF INTRAOPERATIVE CT SCAN N/A 05/22/2022   Procedure: APPLICATION OF INTRAOPERATIVE CT SCAN;  Surgeon: Venetia Night, MD;  Location: [facility] ORS;  Service: Neurosurgery;  Laterality: N/A;    Home Medications:  Allergies as of 07/24/2023   No Known Allergies      Medication List        Accurate as of July 24, 2023  2:50 PM. If you have any questions, ask your nurse or doctor.          hydrochlorothiazide 12.5 MG tablet Commonly known as: HYDRODIURIL Take 12.5 mg by mouth daily.   losartan 100 MG tablet Commonly known as: COZAAR Take 100 mg by mouth daily.        Allergies:  No Known Allergies  Family History: Family History  Problem Relation Age of Onset   Prostate cancer Father 40       Underwent unknown treatment, deceased from other cause   Prostate cancer Brother 46       S/p prostatectomy, no mets, still living    Social  History:   reports that he has never smoked. He has never been exposed to tobacco smoke. He has never used smokeless tobacco. He reports current alcohol use of about 1.0 standard drink of alcohol per week. He reports that he does not use drugs.  Physical Exam: BP 132/83   Pulse 97   Ht 6' (1.829 m)   Wt 235 lb (106.6 kg)   BMI 31.87 kg/m   Constitutional:  Alert and oriented, no acute distress, nontoxic appearing HEENT: [place], AT Cardiovascular: No clubbing, cyanosis, or edema Respiratory: Normal respiratory effort, no increased work of breathing GU: Smooth, symmetrically enlarged 40+ cc prostate without nodules or induration Skin: No rashes, bruises or suspicious lesions Neurologic: Grossly intact, no focal deficits, moving all 4 extremities Psychiatric: Normal mood and affect  Laboratory Data: Results for orders placed or performed in visit on 07/24/23  Bladder Scan (Post Void Residual) in office  Result Value Ref Range   Scan Result 0     Assessment & Plan:   1. Benign prostatic hyperplasia without lower urinary tract symptoms Minimally symptomatic off pharmacotherapy.  He is emptying appropriately with no acute concerns.  Will continue to monitor.  His PSA did double this year, most recent value about 85 months old.  Will repeat PSA today and contact him with results tomorrow.  Notably, PSA was drawn prior to DRE today and DRE was benign.  Will continue to monitor, especially with his family history of prostate cancer. - Bladder Scan (Post Void Residual) in office - PSA Total (Reflex To Free)  Return in about 1 year (around 07/23/2024) for Annual DRE/IPSS/PVR/PSA.  Carman Ching, PA-C  [facility] [place] [address], [address] [place], [place] [address] [phone]

## 2023-07-25 ENCOUNTER — Emergency Department
Admission: EM | Admit: 2023-07-25 | Discharge: 2023-07-25 | Disposition: A | Discharge status: Home / Self Care | Payer: Worker's Compensation | Attending: Emergency Medicine | Admitting: Emergency Medicine

## 2023-07-25 ENCOUNTER — Emergency Department: Payer: Worker's Compensation

## 2023-07-25 ENCOUNTER — Other Ambulatory Visit: Payer: Self-pay

## 2023-07-25 DIAGNOSIS — S0990XA Unspecified injury of head, initial encounter: Secondary | ICD-10-CM | POA: Diagnosis present

## 2023-07-25 DIAGNOSIS — M546 Pain in thoracic spine: Secondary | ICD-10-CM | POA: Insufficient documentation

## 2023-07-25 DIAGNOSIS — M25551 Pain in right hip: Secondary | ICD-10-CM | POA: Diagnosis not present

## 2023-07-25 DIAGNOSIS — W1789XA Other fall from one level to another, initial encounter: Secondary | ICD-10-CM | POA: Insufficient documentation

## 2023-07-25 DIAGNOSIS — Y990 Civilian activity done for income or pay: Secondary | ICD-10-CM | POA: Diagnosis not present

## 2023-07-25 DIAGNOSIS — S299XXA Unspecified injury of thorax, initial encounter: Secondary | ICD-10-CM | POA: Diagnosis not present

## 2023-07-25 DIAGNOSIS — I10 Essential (primary) hypertension: Secondary | ICD-10-CM | POA: Diagnosis not present

## 2023-07-25 DIAGNOSIS — M549 Dorsalgia, unspecified: Secondary | ICD-10-CM

## 2023-07-25 DIAGNOSIS — W19XXXA Unspecified fall, initial encounter: Secondary | ICD-10-CM

## 2023-07-25 LAB — PSA TOTAL (REFLEX TO FREE): Prostate Specific Ag, Serum: 1.6 ng/mL (ref 0.0–4.0)

## 2023-07-25 LAB — CBC WITH DIFFERENTIAL/PLATELET
Abs Immature Granulocytes: 0.03 10*3/uL (ref 0.00–0.07)
Basophils Absolute: 0.1 10*3/uL (ref 0.0–0.1)
Basophils Relative: 0 %
Eosinophils Absolute: 0.1 10*3/uL (ref 0.0–0.5)
Eosinophils Relative: 1 %
HCT: 44 % (ref 39.0–52.0)
Hemoglobin: 15.3 g/dL (ref 13.0–17.0)
Immature Granulocytes: 0 %
Lymphocytes Relative: 15 %
Lymphs Abs: 1.8 10*3/uL (ref 0.7–4.0)
MCH: 33 pg (ref 26.0–34.0)
MCHC: 34.8 g/dL (ref 30.0–36.0)
MCV: 94.8 fL (ref 80.0–100.0)
Monocytes Absolute: 0.8 10*3/uL (ref 0.1–1.0)
Monocytes Relative: 7 %
Neutro Abs: 9.1 10*3/uL — ABNORMAL HIGH (ref 1.7–7.7)
Neutrophils Relative %: 77 %
Platelets: 206 10*3/uL (ref 150–400)
RBC: 4.64 MIL/uL (ref 4.22–5.81)
RDW: 12.5 % (ref 11.5–15.5)
WBC: 11.9 10*3/uL — ABNORMAL HIGH (ref 4.0–10.5)
nRBC: 0 % (ref 0.0–0.2)

## 2023-07-25 LAB — BASIC METABOLIC PANEL
Anion gap: 9 (ref 5–15)
BUN: 15 mg/dL (ref 8–23)
CO2: 26 mmol/L (ref 22–32)
Calcium: 9.3 mg/dL (ref 8.9–10.3)
Chloride: 103 mmol/L (ref 98–111)
Creatinine, Ser: 0.89 mg/dL (ref 0.61–1.24)
GFR, Estimated: >60 mL/min (ref >60)
Glucose, Bld: 104 mg/dL — ABNORMAL HIGH (ref 70–99)
Potassium: 3.5 mmol/L (ref 3.5–5.1)
Sodium: 138 mmol/L (ref 135–145)

## 2023-07-25 MED ORDER — OXYCODONE HCL 5 MG PO TABS: 5.0000 mg | ORAL_TABLET | Freq: Three times a day (TID) | ORAL | 0 refills | Status: AC | PRN

## 2023-07-25 MED ORDER — LORAZEPAM 2 MG/ML IJ SOLN: 1.0000 mg | Freq: Once | INTRAMUSCULAR | Status: DC

## 2023-07-25 MED ORDER — MORPHINE SULFATE (PF) 4 MG/ML IV SOLN
4.0000 mg | Freq: Once | INTRAVENOUS | Status: AC
  Administered 2023-07-25: 4 mg via INTRAVENOUS
  Filled 2023-07-25: qty 1

## 2023-07-25 MED ORDER — KETOROLAC TROMETHAMINE 15 MG/ML IJ SOLN
15.0000 mg | Freq: Once | INTRAMUSCULAR | Status: AC
  Administered 2023-07-25: 15 mg via INTRAVENOUS
  Filled 2023-07-25: qty 1

## 2023-07-25 MED ORDER — OXYCODONE HCL 5 MG PO TABS
5.0000 mg | ORAL_TABLET | Freq: Once | ORAL | Status: AC
  Administered 2023-07-25: 5 mg via ORAL
  Filled 2023-07-25: qty 1

## 2023-07-25 MED ORDER — ACETAMINOPHEN 500 MG PO TABS
1000.0000 mg | ORAL_TABLET | Freq: Once | ORAL | Status: AC
  Administered 2023-07-25: 1000 mg via ORAL
  Filled 2023-07-25: qty 2

## 2023-07-25 MED ORDER — IOHEXOL 300 MG/ML  SOLN
100.0000 mL | Freq: Once | INTRAMUSCULAR | Status: AC | PRN
  Administered 2023-07-25: 100 mL via INTRAVENOUS

## 2023-07-25 MED ORDER — DEXAMETHASONE SODIUM PHOSPHATE 10 MG/ML IJ SOLN: 8.0000 mg | Freq: Once | INTRAMUSCULAR | Status: DC

## 2023-07-25 NOTE — Discharge Instructions (Addendum)
Fortunately your evaluation in the emergency department did not show any life-threatening injuries as a result of your fall.  Take acetaminophen 650 mg and ibuprofen 400 mg every 6 hours for pain.  Take with food. Use oxycodone as needed for severe pain.   Thank you for choosing [place] for your health care today!  Please see your primary doctor this week for a follow up appointment.   If you have any new, worsening, or unexpected symptoms call your doctor right away or come back to the emergency department for reevaluation.  It was my pleasure to care for you today.   Daneil Dan Modesto Charon, MD

## 2023-07-25 NOTE — ED Notes (Signed)
Pt given half cup of water per Dr. Modesto Charon.

## 2023-07-25 NOTE — ED Notes (Signed)
Pt returned from CT °

## 2023-07-25 NOTE — ED Provider Notes (Signed)
[facility] Provider Note    Event Date/Time   First MD Initiated Contact with Patient 07/25/23 1506     (approximate)   History   Fall   HPI  Douglas Gibson is a 62 y.o. male   Past medical history of hypertension who presents to the emergency department with a fall.  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  He did strike his head and did not lose consciousness.  He does not take blood thinner.  His chief complaint now is headache, upper back pain, and right hip pain.  He was able to get up on his own and ambulate to his car and drive himself to the hospital.  Independent Historian contributed to assessment above: His wife is at bedside to corroborate information past medical history as above    Physical Exam   Triage Vital Signs: ED Triage Vitals  Encounter Vitals Group     BP 07/25/23 1433 (!) 162/91     Systolic BP Percentile --      Diastolic BP Percentile --      Pulse Rate 07/25/23 1433 85     Resp 07/25/23 1433 20     Temp 07/25/23 1433 98.6 F (37 C)     Temp src --      SpO2 07/25/23 1433 98 %     Weight --      Height --      Head Circumference --      Peak Flow --      Pain Score 07/25/23 1434 9     Pain Loc --      Pain Education --      Exclude from Growth Chart --     Most recent vital signs: Vitals:   07/25/23 1730 07/25/23 1826  BP: 139/78   Pulse: 69   Resp: 17   Temp:  98.6 F (37 C)  SpO2: 100%     General: Awake, no distress.  CV:  Good peripheral perfusion.  Resp:  Normal effort.  Abd:  No distention.  Other:  Pleasant gentleman in c-collar, laying in stretcher with no acute distress.  No obvious signs of external head injury or depressed skull fracture, C-spine without deformities tenderness to palpation.  He has some sternal tenderness with palpation.  He has some right buttock tenderness to palpation.  He has  mid thoracic tenderness to palpation without obvious step-off or deformity.  He has full motor and sensory function throughout his entire body otherwise and there are no other obvious signs of injury.   ED Results / Procedures / Treatments   Labs (all labs ordered are listed, but only abnormal results are displayed) Labs Reviewed  BASIC METABOLIC PANEL - Abnormal; Notable for the following components:      Result Value   Glucose, Bld 104 (*)    All other components within normal limits  CBC WITH DIFFERENTIAL/PLATELET - Abnormal; Notable for the following components:   WBC 11.9 (*)    Neutro Abs 9.1 (*)    All other components within normal limits    I ordered and reviewed the above labs they are notable for mild leukocytosis otherwise normal labs  RADIOLOGY I independently reviewed and interpreted CT of the head see no obvious bleed or midline shift I also reviewed radiologist's formal read.   PROCEDURES:  Critical Care performed: No  Procedures   MEDICATIONS ORDERED IN ED: Medications  LORazepam (ATIVAN) injection 1 mg (0 mg Intravenous Hold 07/25/23 1452)  morphine (PF) 4 MG/ML injection 4 mg (4 mg Intravenous Given 07/25/23 1528)  iohexol (OMNIPAQUE) 300 MG/ML solution 100 mL (100 mLs Intravenous Contrast Given 07/25/23 1704)  morphine (PF) 4 MG/ML injection 4 mg (4 mg Intravenous Given 07/25/23 1747)    IMPRESSION / MDM / ASSESSMENT AND PLAN / ED COURSE  I reviewed the triage vital signs and the nursing notes.                                Patient's presentation is most consistent with acute presentation with potential threat to life or bodily function.  Differential diagnosis includes, but is not limited to, blunt traumatic injury ich, skull fracture, c spine, sternal fx, pneuomothorax, hemothorax, rib fracture, pelvic fracture, organ laceration   The patient is on the cardiac monitor to evaluate for evidence of arrhythmia and/or significant heart rate  changes.  MDM:    Devere mechanism fall from about 10 feet with head strike, pain in the torso, pain in thoracic spine, pelvis, CT head neck chest abdomen pelvis ordered, patient hemodynamically stable, given IV morphine for pain control.  Fortunately no acute traumatic injuries noted on CT scans imaging patient has been comfortable with pain medications administered in the emergency department, plan is for discharge.      FINAL CLINICAL IMPRESSION(S) / ED DIAGNOSES   Final diagnoses:  Fall, initial encounter  Injury of head, initial encounter  Chest wall injury, initial encounter  Acute upper back pain     Rx / DC Orders   ED Discharge Orders          Ordered    oxyCODONE (ROXICODONE) 5 MG immediate release tablet  Every 8 hours PRN        07/25/23 1916             Note:  This document was prepared using Dragon voice recognition software and may include unintentional dictation errors.    Pilar Jarvis, MD 07/25/23 Jerene Bears

## 2023-07-25 NOTE — ED Notes (Signed)
Pt taken to CT.

## 2023-07-25 NOTE — ED Triage Notes (Signed)
Pt to ED for fall 10 ft onto concrete from truck trailer onto right side. +hit head. Denies LOC.  Reports pain to upper back, right shoulder, right hip and head.  Reports having difficulty turning head.  Hx back surgery Placed in c collar in triage.

## 2023-07-25 NOTE — ED Notes (Signed)
Pt stating he is extremely claustrophobic, spoke to Dr. Vicente Males for orders. Upon return to room pt stating he will try without meds.

## 2023-07-27 DIAGNOSIS — R972 Elevated prostate specific antigen [PSA]: Secondary | ICD-10-CM

## 2023-08-16 MED ORDER — DIAZEPAM 2 MG PO TABS: ORAL_TABLET | ORAL | 0 refills | Status: DC

## 2023-08-16 NOTE — Addendum Note (Signed)
 Addended by: Debarah Crape on: 08/16/2023 05:06 PM   Modules accepted: Orders

## 2023-08-17 ENCOUNTER — Encounter: Payer: Self-pay | Admitting: Neurosurgery

## 2023-08-17 NOTE — Telephone Encounter (Signed)
 Patient called our office in response to my previous my chart message. He described that he had a fall at work leading him to go to the emergency room. He was examined about 3-4 weeks ago but still has some  fluctuating pain in his back and shoulders as a result of this fall.   He said that he wanted to follow up with Dr. Clois however, his worker's comp will not cover a visit. He states that Dr. Clois told him to inform our office if he ever had a fall. He is requesting if Dr. Clois could look over his scans from his emergency room visit on 12/11. He said that he is happy to come in and see Dr. Clois or follow any recommendation that he has. I informed him that he may not be able to see him personally but that I would ask.

## 2023-08-17 NOTE — Telephone Encounter (Signed)
 I called back Douglas Gibson to relay Dr. Lucienne Capers impression of his scans. He was very grateful for his time in reviewing these scans. He also states that " He might be the best Doctor I've ever seen".

## 2023-08-28 MED ORDER — DIAZEPAM 5 MG PO TABS: ORAL_TABLET | ORAL | 0 refills | Status: AC

## 2023-08-28 NOTE — Addendum Note (Signed)
 Addended by: Debarah Crape on: 08/28/2023 05:28 PM   Modules accepted: Orders

## 2024-07-23 ENCOUNTER — Ambulatory Visit: Payer: Self-pay | Admitting: Physician Assistant
